# Patient Record
Sex: Female | Born: 1990 | Race: White | Hispanic: No | Marital: Married | Smoking: Never smoker
Health system: Southern US, Community
[De-identification: ages and names within clinical notes are randomized; demographics above are authoritative.]

## PROBLEM LIST (undated history)

## (undated) ENCOUNTER — Inpatient Hospital Stay (HOSPITAL_COMMUNITY): Payer: Self-pay

## (undated) DIAGNOSIS — D219 Benign neoplasm of connective and other soft tissue, unspecified: Secondary | ICD-10-CM

## (undated) DIAGNOSIS — Z789 Other specified health status: Secondary | ICD-10-CM

## (undated) HISTORY — PX: NO PAST SURGERIES: SHX2092

---

## 2017-04-06 NOTE — L&D Delivery Note (Addendum)
Ashley Sanchez is a 27 y.o. G1P0010 who delivered at non-viable fetus at 23. Called to the room by RN as patient felt an urge to push. Cervix was found to be 7-8 cm with small fetal parts felt at the os. Patient involuntary pushed, and fetal arm and cord prolapsed. After another push the feet and the body then delivered. After the third push the rest of the fetus was born. Fetus was non-viable with no signs of life noted at delivery. Cord was cut and clamped. Dr. Benjie Karvonen arrived to the room. She assisted with swaddling the fetus and presenting to the parents. She assumed care for the delivery of the placenta. Please see her notes.   Marcille Buffy 9:18 PM 07/09/17

## 2017-05-21 ENCOUNTER — Other Ambulatory Visit (HOSPITAL_COMMUNITY): Payer: Self-pay | Admitting: Certified Nurse Midwife

## 2017-05-21 ENCOUNTER — Ambulatory Visit (HOSPITAL_COMMUNITY)
Admission: RE | Admit: 2017-05-21 | Discharge: 2017-05-21 | Disposition: A | Discharge status: Home / Self Care | Payer: BC Managed Care – PPO | Source: Ambulatory Visit | Attending: Certified Nurse Midwife | Admitting: Certified Nurse Midwife

## 2017-05-21 DIAGNOSIS — O209 Hemorrhage in early pregnancy, unspecified: Secondary | ICD-10-CM

## 2017-05-21 DIAGNOSIS — Z3A12 12 weeks gestation of pregnancy: Secondary | ICD-10-CM | POA: Insufficient documentation

## 2017-05-21 DIAGNOSIS — O26851 Spotting complicating pregnancy, first trimester: Secondary | ICD-10-CM | POA: Insufficient documentation

## 2017-05-24 ENCOUNTER — Other Ambulatory Visit (HOSPITAL_COMMUNITY): Payer: Self-pay

## 2017-05-24 ENCOUNTER — Ambulatory Visit (HOSPITAL_COMMUNITY): Payer: Self-pay

## 2017-06-07 ENCOUNTER — Other Ambulatory Visit (HOSPITAL_COMMUNITY): Payer: Self-pay | Admitting: Certified Nurse Midwife

## 2017-06-07 DIAGNOSIS — Z3A15 15 weeks gestation of pregnancy: Secondary | ICD-10-CM

## 2017-06-07 DIAGNOSIS — O4692 Antepartum hemorrhage, unspecified, second trimester: Secondary | ICD-10-CM

## 2017-06-07 DIAGNOSIS — Z362 Encounter for other antenatal screening follow-up: Secondary | ICD-10-CM

## 2017-06-08 ENCOUNTER — Other Ambulatory Visit (HOSPITAL_COMMUNITY): Payer: Self-pay | Admitting: Certified Nurse Midwife

## 2017-06-08 ENCOUNTER — Inpatient Hospital Stay (HOSPITAL_COMMUNITY)
Admission: AD | Admit: 2017-06-08 | Discharge: 2017-06-08 | Disposition: A | Discharge status: Home / Self Care | Payer: BC Managed Care – PPO | Source: Ambulatory Visit | Attending: Obstetrics and Gynecology | Admitting: Obstetrics and Gynecology

## 2017-06-08 ENCOUNTER — Encounter (HOSPITAL_COMMUNITY): Payer: Self-pay | Admitting: *Deleted

## 2017-06-08 ENCOUNTER — Ambulatory Visit (HOSPITAL_COMMUNITY)
Admission: RE | Admit: 2017-06-08 | Discharge: 2017-06-08 | Disposition: A | Discharge status: Home / Self Care | Payer: BC Managed Care – PPO | Source: Ambulatory Visit | Attending: Certified Nurse Midwife | Admitting: Certified Nurse Midwife

## 2017-06-08 DIAGNOSIS — O4102X0 Oligohydramnios, second trimester, not applicable or unspecified: Secondary | ICD-10-CM | POA: Insufficient documentation

## 2017-06-08 DIAGNOSIS — O4692 Antepartum hemorrhage, unspecified, second trimester: Secondary | ICD-10-CM

## 2017-06-08 DIAGNOSIS — Z362 Encounter for other antenatal screening follow-up: Secondary | ICD-10-CM

## 2017-06-08 DIAGNOSIS — Z3A15 15 weeks gestation of pregnancy: Secondary | ICD-10-CM

## 2017-06-08 DIAGNOSIS — Z3689 Encounter for other specified antenatal screening: Secondary | ICD-10-CM | POA: Insufficient documentation

## 2017-06-08 DIAGNOSIS — O4592 Premature separation of placenta, unspecified, second trimester: Secondary | ICD-10-CM | POA: Insufficient documentation

## 2017-06-08 DIAGNOSIS — O209 Hemorrhage in early pregnancy, unspecified: Secondary | ICD-10-CM | POA: Diagnosis present

## 2017-06-08 DIAGNOSIS — O3412 Maternal care for benign tumor of corpus uteri, second trimester: Secondary | ICD-10-CM

## 2017-06-08 DIAGNOSIS — O4290 Premature rupture of membranes, unspecified as to length of time between rupture and onset of labor, unspecified weeks of gestation: Secondary | ICD-10-CM

## 2017-06-08 DIAGNOSIS — D259 Leiomyoma of uterus, unspecified: Secondary | ICD-10-CM

## 2017-06-08 LAB — POCT FERN TEST: POCT FERN TEST: NEGATIVE

## 2017-06-08 IMAGING — US US MFM OB DETAIL+14 WK
1 series · 13 of 28 positions shown · non-contrast
Comparison: none

[Series 1: us mfm ob detail+14 wk · 73 acquisitions, 13 frames shown]
[im 3/73]
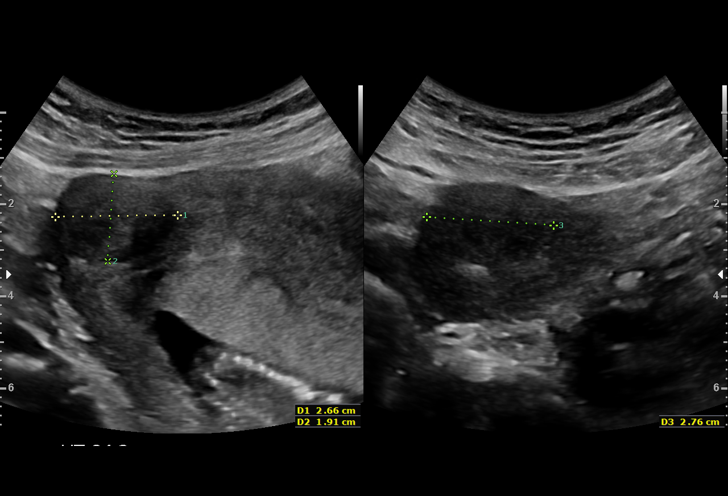
[im 9/73]
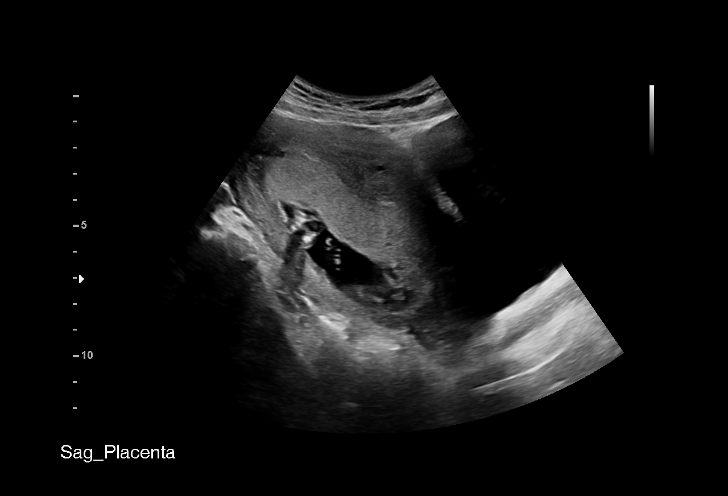
[im 14/73]
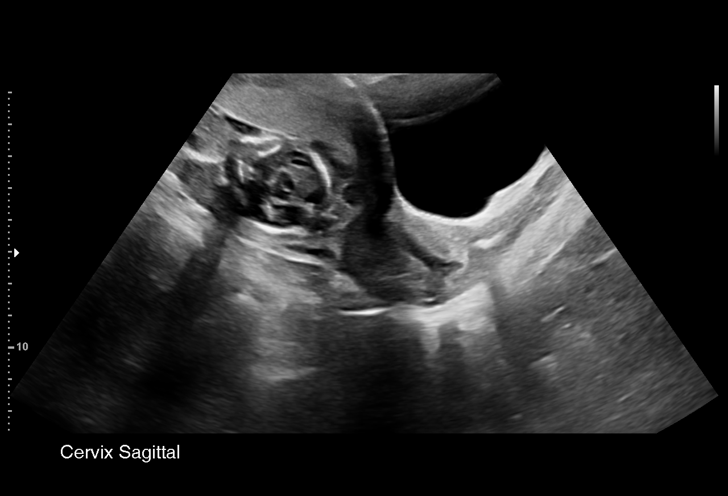
[im 19/73]
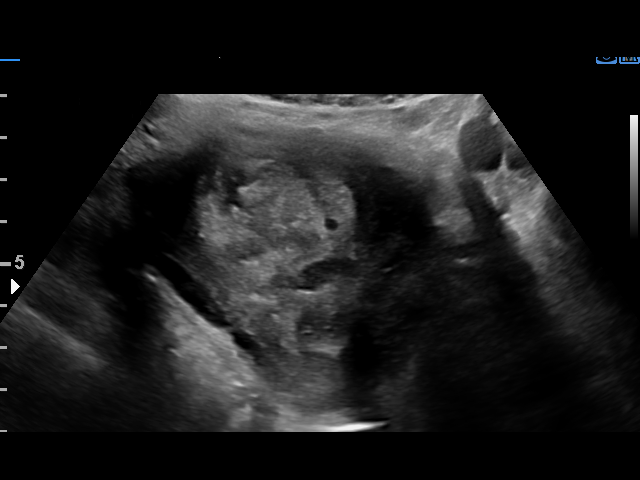
[im 25/73]
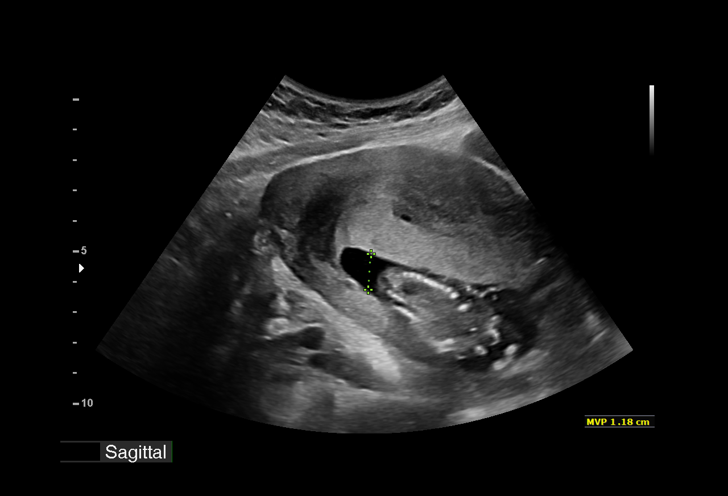
[im 30/73]
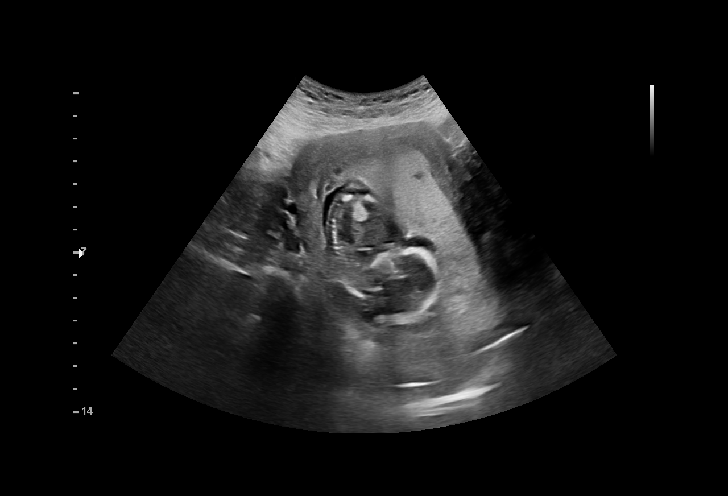
[im 38/73]
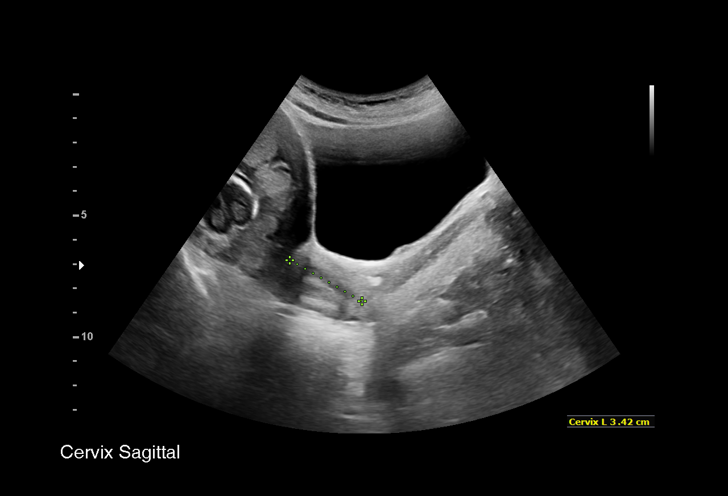
[im 43/73]
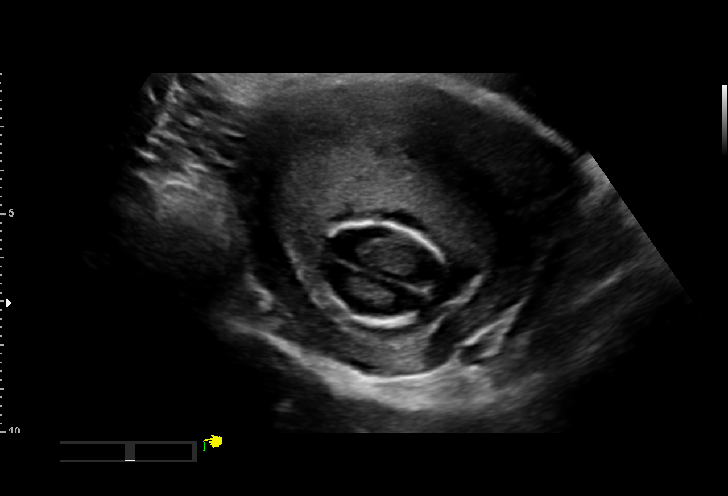
[im 49/73]
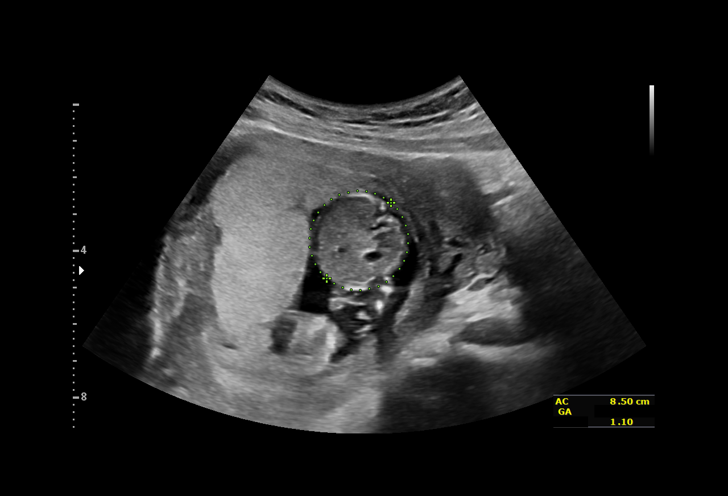
[im 54/73]
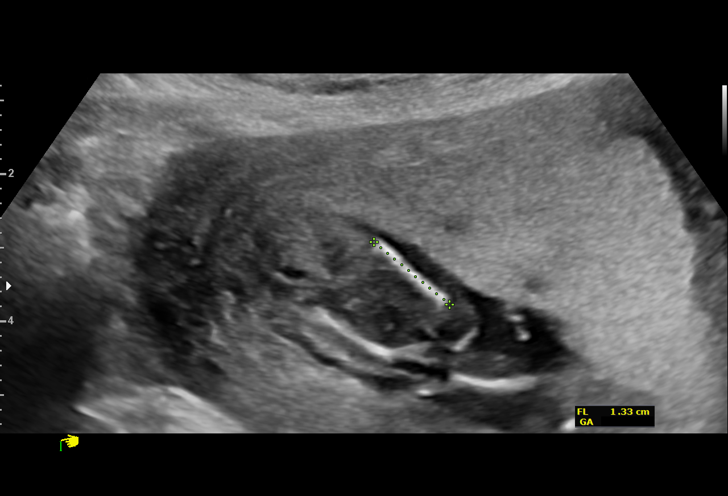
[im 59/73]
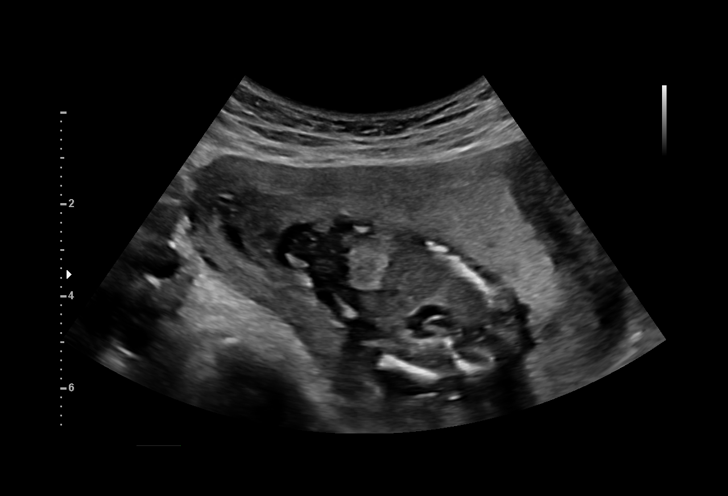
[im 65/73]
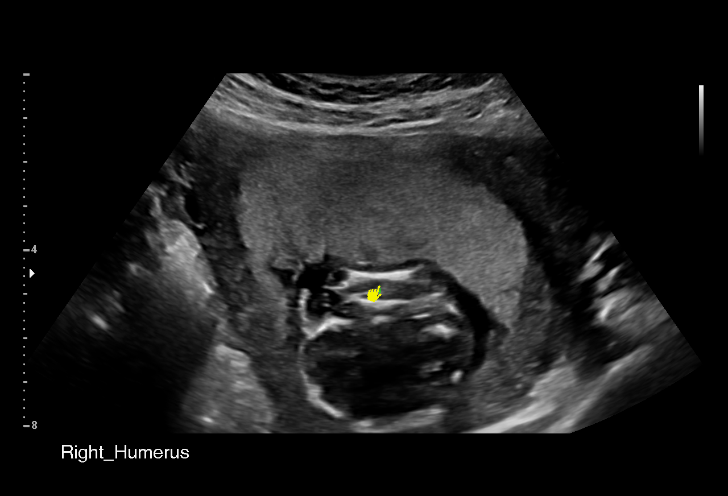
[im 70/73]
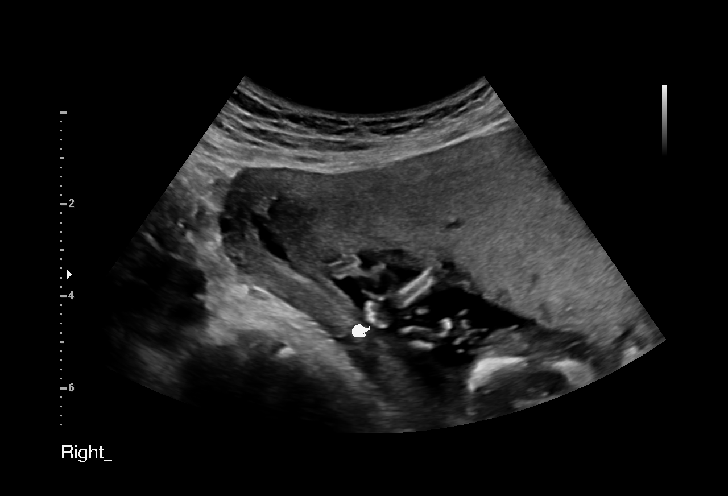

[13 of 28 positions shown; findings below may reference images not displayed]

Center

[facility]             [PHONE_NUMBER]      [PHONE_NUMBER]     [PHONE_NUMBER]
Indications

15 weeks gestation of pregnancy
Vaginal bleeding in pregnancy, second          [AP]
trimester
Oligohydraminios, second trimester,            [AP]
unspecified
Premature rupture of membranes - leaking       [AP]
fluid
Encounter for fetal anatomic survey            [AP]
Fetal Evaluation

Num Of Fetuses:     1
Fetal Heart         154
Rate(bpm):
Cardiac Activity:   Observed
Presentation:       Cephalic
Placenta:           Anterior
P. Cord Insertion:  Not well visualized

Amniotic Fluid
AFI FV:      Oligohydramnios

Largest Pocket(cm)
1.18

Comment:    Moderate subchorionic hemorrhage noted.
Biometry

BPD:      25.9  mm     G. Age:  14w 3d          8  %    CI:        70.18   %    70 - 86
FL/HC:      13.4   %    15.3 -
HC:       98.6  mm     G. Age:  14w 4d          4  %    HC/AC:      1.20        1.05 -
AC:       82.4  mm     G. Age:  14w 4d         25  %    FL/BPD:     51.0   %
FL:       13.2  mm     G. Age:  13w 6d          3  %    FL/AC:      16.0   %    20 - 24
HUM:      13.9  mm     G. Age:  13w 6d        < 5  %

Est. FW:      94  gm      0 lb 3 oz     50  %
Gestational Age

LMP:           15w 3d        Date:  [DATE]                 EDD:   [DATE]
U/S Today:     14w 3d                                        EDD:   [DATE]
Best:          15w 3d     Det. By:  LMP  ([DATE])          EDD:   [DATE]
Anatomy

Cranium:               Appears normal         Aortic Arch:            Not well visualized
Cavum:                 Not well visualized    Ductal Arch:            Not well visualized
Ventricles:            Appears normal         Diaphragm:              Not well visualized
Choroid Plexus:        Appears normal         Stomach:                Appears normal, left
sided
Cerebellum:            Not well visualized    Abdomen:                Not well visualized
Posterior Fossa:       Not well visualized    Abdominal Wall:         Not well visualized
Nuchal Fold:           Not well visualized    Cord Vessels:           Appears normal (3
vessel cord)
Face:                  Not well visualized    Kidneys:                Not well visualized
Lips:                  Not well visualized    Bladder:                Not well visualized
Heart:                 Not well visualized    Spine:                  Not well visualized
RVOT:                  Not well visualized    Upper Extremities:      Visualized
LVOT:                  Not well visualized    Lower Extremities:      Visualized

Other:  Technically difficult due to early gestational age.
Cervix Uterus Adnexa

Cervix
Length:           3.31  cm.

Uterus
Single fibroid noted, see table below.

Left Ovary
Size(cm)       3.4  x   1.8    x  2.2       Vol(ml): 7
Within normal limits.

Right Ovary
Size(cm)       3.6  x   2.1    x  2.5       Vol(ml):
Within normal limits.
Myomas

Site                     L(cm)      W(cm)      D(cm)      Location
Fundus

Blood Flow                 RI        PI       Comments

Impression

Single living intrauterine pregnancy at 15w 3d.
Placenta Anterior with noted subchorionic
hemorrhage/marginal abruption at the leading edge and
measures 0.9 x 1.5 x 1.2 cm
Appropriate fetal growth.
amniotic fluid volume is decreased raising suspicion for
pPROM, especially in context of patient reports of watery,
bloody gushes of fluid per vagina
No gross fetal anomalies identified in this survey limited by
early gestational age, noting the stomach is left-sided and fills
normally.  fetal bladder appears present but is not see well
enough for definitive evaluation given early gestational age
The cervix measures 3.31cm transabdominally without
funneling.
The adnexa appear normal bilaterally without masses.
Recommendations

Patient sent to [facility] for evaluation for pPROM and vaginal
bleeding by sterile speculum examination and Amnisure.
Follow up thereafter to be predicated by results of
examination.  That being said, it would be reasonable to
assess fetal viability by limited ultrasound weekly given
risk/concern for pregnancy loss due to marginal abruption
and patient report of significant vaginal bleeding.  I would
recommend such evaluations with MFM to facilitate continued
detailed evaluation of the fetal anatomy to assess organ
development (eg, fetal kidneys, etc).

## 2017-06-08 NOTE — Discharge Instructions (Signed)
Vaginal Bleeding During Pregnancy, Second Trimester A small amount of bleeding (spotting) from the vagina is relatively common in pregnancy. It usually stops on its own. Various things can cause bleeding or spotting in pregnancy. Some bleeding may be related to the pregnancy, and some may not. Sometimes the bleeding is normal and is not a problem. However, bleeding can also be a sign of something serious. Be sure to tell your health care provider about any vaginal bleeding right away. Some possible causes of vaginal bleeding during the second trimester include:  Infection, inflammation, or growths on the cervix.  The placenta may be partially or completely covering the opening of the cervix inside the uterus (placenta previa).  The placenta may have separated from the uterus (abruption of the placenta).  You may be having early (preterm) labor.  The cervix may not be strong enough to keep a baby inside the uterus (cervical insufficiency).  Tiny cysts may have developed in the uterus instead of pregnancy tissue (molar pregnancy).  Follow these instructions at home: Watch your condition for any changes. The following actions may help to lessen any discomfort you are feeling:  Follow your health care provider's instructions for limiting your activity. If your health care provider orders bed rest, you may need to stay in bed and only get up to use the bathroom. However, your health care provider may allow you to continue light activity.  If needed, make plans for someone to help with your regular activities and responsibilities while you are on bed rest.  Keep track of the number of pads you use each day, how often you change pads, and how soaked (saturated) they are. Write this down.  Do not use tampons. Do not douche.  Do not have sexual intercourse or orgasms until approved by your health care provider.  If you pass any tissue from your vagina, save the tissue so you can show it to your  health care provider.  Only take over-the-counter or prescription medicines as directed by your health care provider.  Do not take aspirin because it can make you bleed.  Do not exercise or perform any strenuous activities or heavy lifting without your health care provider's permission.  Keep all follow-up appointments as directed by your health care provider.  Contact a health care provider if:  You have any vaginal bleeding during any part of your pregnancy.  You have cramps or labor pains.  You have a fever, not controlled by medicine. Get help right away if:  You have severe cramps in your back or belly (abdomen).  You have contractions.  You have chills.  You pass large clots or tissue from your vagina.  Your bleeding increases.  You feel light-headed or weak, or you have fainting episodes.  Pelvic Rest Pelvic rest may be recommended if: Your placenta is partially or completely covering the opening of your cervix (placenta previa). There is bleeding between the wall of the uterus and the amniotic sac in the first trimester of pregnancy (subchorionic hemorrhage). You went into labor too early (preterm labor).  Based on your overall health and the health of your baby, your health care provider will decide if pelvic rest is right for you. How do I rest my pelvis? For as long as told by your health care provider: Do not have sex, sexual stimulation, or an orgasm. Do not use tampons. Do not douche. Do not put anything in your vagina. Do not lift anything that is heavier than 10 lb (  4.5 kg). Avoid activities that take a lot of effort (are strenuous). Avoid any activity in which your pelvic muscles could become strained.  When should I seek medical care? Seek medical care if you have: Cramping pain in your lower abdomen. Vaginal discharge. A low, dull backache. Regular contractions. Uterine tightening.  When should I seek immediate medical care? Seek immediate  medical care if: You have vaginal bleeding and you are pregnant.  This information is not intended to replace advice given to you by your health care provider. Make sure you discuss any questions you have with your health care provider. Document Released: 07/18/2010 Document Revised: 08/29/2015 Document Reviewed: 09/24/2014 Elsevier Interactive Patient Education  2018 [place] are leaking fluid or have a gush of fluid from your vagina. This information is not intended to replace advice given to you by your health care provider. Make sure you discuss any questions you have with your health care provider. Document Released: 12/31/2004 Document Revised: 08/29/2015 Document Reviewed: 11/28/2012 Elsevier Interactive Patient Education  [employer].

## 2017-06-08 NOTE — MAU Note (Signed)
Pt sent from MFM s/p u/s that showed ROM at 15 weeks

## 2017-06-08 NOTE — MAU Provider Note (Signed)
History    Arielle Strassman ia a 27 y.o. at [redacted]w[redacted]d, seen in MFM / ultrasound for persisting vaginal bleeding. Noted to have minimal fluid and sent to [facility] to evaluate for PPROM.   CSN: [id number]  Arrival date and time: 06/08/17 1610   None     Chief Complaint  Patient presents with  . Vaginal Bleeding   HPI Patient reported spotting at 12+ wks and sono then showed small [facility]. Bleeding continued intermittent despite modified rest at home and pelvic rest. Patient reported several gushes of bloody discharge at home over the weekend, enough to fill out a panty liner and soil her clothes.  Denies fever/chills. No cramping, notes some soreness over lowe abdomen since yesterday.   Prenatal care at [facility] since 12 wks gest.  Prenatal labs O pos, AB neg, RNI, HIV/HepB/RPR neg, UCX neg. GC/CT neg. Declined genetic screen.  OB History    Gravida Para Term Preterm AB Living   1             SAB TAB Ectopic Multiple Live Births                 Medical hx non-contributory No sig family medical hx  Social History   Tobacco Use  . Smoking status: Not on file  Substance Use Topics  . Alcohol use: Not on file  . Drug use: Not on file    Allergies: Allergies not on file  No medications prior to admission.    Review of Systems  Constitutional: Positive for fever. Negative for chills.  Respiratory: Negative for shortness of breath.   Genitourinary: Positive for vaginal bleeding and vaginal discharge. Negative for pelvic pain.  Musculoskeletal: Negative for arthralgias.  Neurological: Negative for dizziness and headaches.   Physical Exam   Blood pressure 132/76, pulse 92, temperature 98.4 F (36.9 C), temperature source Oral, resp. rate 16, SpO2 97 %.  Physical Exam  Nursing note and vitals reviewed. Constitutional: She is oriented to person, place, and time. She appears well-developed and well-nourished.  HENT:  Head: Normocephalic.  Respiratory: Effort normal and  breath sounds normal.  GI: Soft. There is no tenderness.  Genitourinary: Vagina normal. Pelvic exam was performed with patient supine. Cervix exhibits discharge. Cervix exhibits no motion tenderness and no friability.  Genitourinary Comments: SSE done, small amount of bloody fluid in posterior fornix, no gushing w/ valsalva, negative ferning. Amnisure deferred (too bloody)  Musculoskeletal: Normal range of motion. She exhibits no edema.  Neurological: She is alert and oriented to person, place, and time.  Skin: Skin is warm and dry.  Psychiatric: She has a normal mood and affect.    MAU Course  Procedures ----------------------------------------------------------------------  OBSTETRICS REPORT                      (Signed Final 06/08/2017 04:39 pm) ---------------------------------------------------------------------- Patient Info  ID #:       [id number]                          D.O.B.:  08/14/1990 (26 yrs)  Name:       Jenene Slicker                   Visit Date: 06/08/2017 01:28 pm ---------------------------------------------------------------------- Performed By  Performed By:     Corky Crafts             Ref. Address:     [employer]  [facility]                                                             Center  Attending:        Oralia Rud       Location:         [facility]                    MD  Referred By:      Artelia Laroche                    CNM ---------------------------------------------------------------------- Orders   #  Description                                 Code   1  [place] MFM OB DETAIL +14 [place]                     76811.01  ----------------------------------------------------------------------   #  Ordered By               Order #        Accession #    Episode #   1  Artelia Laroche             [id number]      [id number]     [id number]  ---------------------------------------------------------------------- Indications   [redacted] weeks gestation of  pregnancy                Z3A.15   Vaginal bleeding in pregnancy, second          O46.92   trimester   Oligohydraminios, second trimester,            O41.02X0   unspecified   Premature rupture of membranes - leaking       O42.90   fluid   Encounter for fetal anatomic survey            Z36.89  ---------------------------------------------------------------------- Fetal Evaluation  Num Of Fetuses:     1  Fetal Heart         154  Rate(bpm):  Cardiac Activity:   Observed  Presentation:       Cephalic  Placenta:           Anterior  P. Cord Insertion:  Not well visualized  Amniotic Fluid  AFI FV:      Oligohydramnios                              Largest Pocket(cm)                              1.18  Comment:    Moderate subchorionic hemorrhage noted. ---------------------------------------------------------------------- Biometry  BPD:      25.9  mm     G. Age:  14w 3d          8  %    CI:        70.18   %    70 - 86  FL/HC:      13.4   %    15.3 - 17.1  HC:       98.6  mm     G. Age:  14w 4d          4  %    HC/AC:      1.20        1.05 - 1.39  AC:       82.4  mm     G. Age:  14w 4d         25  %    FL/BPD:     51.0   %  FL:       13.2  mm     G. Age:  13w 6d          3  %    FL/AC:      16.0   %    20 - 24  HUM:      13.9  mm     G. Age:  13w 6d        < 5  %  Est. FW:      94  gm      0 lb 3 oz     50  % ---------------------------------------------------------------------- Gestational Age  LMP:           15w 3d        Date:  02/20/17                 EDD:   11/27/17  U/S Today:     14w 3d                                        EDD:   12/04/17  Best:          15w 3d     Det. By:  LMP  (02/20/17)          EDD:   11/27/17 ---------------------------------------------------------------------- Anatomy  Cranium:               Appears normal         Aortic Arch:            Not well visualized  Cavum:                 Not well visualized     Ductal Arch:            Not well visualized  Ventricles:            Appears normal         Diaphragm:              Not well visualized  Choroid Plexus:        Appears normal         Stomach:                Appears normal, left                                                                        sided  Cerebellum:  Not well visualized    Abdomen:                Not well visualized  Posterior Fossa:       Not well visualized    Abdominal Wall:         Not well visualized  Nuchal Fold:           Not well visualized    Cord Vessels:           Appears normal (3                                                                        vessel cord)  Face:                  Not well visualized    Kidneys:                Not well visualized  Lips:                  Not well visualized    Bladder:                Not well visualized  Heart:                 Not well visualized    Spine:                  Not well visualized  RVOT:                  Not well visualized    Upper Extremities:      Visualized  LVOT:                  Not well visualized    Lower Extremities:      Visualized  Other:  Technically difficult due to early gestational age. ---------------------------------------------------------------------- Cervix Uterus Adnexa  Cervix  Length:           3.31  cm.  Uterus  Single fibroid noted, see table below.  Left Ovary  Size(cm)       3.4  x   1.8    x  2.2       Vol(ml): 7  Within normal limits.  Right Ovary  Size(cm)       3.6  x   2.1    x  2.5       Vol(ml): 9.9  Within normal limits. ---------------------------------------------------------------------- Myomas   Site                     L(cm)      W(cm)      D(cm)      Location   Fundus                   2.7        1.9        2.8  ----------------------------------------------------------------------   Blood Flow                 RI        PI       Comments   ---------------------------------------------------------------------- Impression  Single living intrauterine pregnancy at 15w  3d.  Placenta Anterior with noted subchorionic  hemorrhage/marginal abruption at the leading edge and  measures 0.9 x 1.5 x 1.2 cm  Appropriate fetal growth.  amniotic fluid volume is decreased raising suspicion for  pPROM, especially in context of patient reports of watery,  bloody gushes of fluid per vagina  No gross fetal anomalies identified in this survey limited by  early gestational age, noting the stomach is left-sided and fills  normally.  fetal bladder appears present but is not see well  enough for definitive evaluation given early gestational age  The cervix measures 3.31cm transabdominally without  funneling.  The adnexa appear normal bilaterally without masses. ---------------------------------------------------------------------- Recommendations  Patient sent to MAU for evaluation for pPROM and vaginal  bleeding by sterile speculum examination and Amnisure.  Follow up thereafter to be predicated by results of  examination.  That being said, it would be reasonable to  assess fetal viability by limited ultrasound weekly given  risk/concern for pregnancy loss due to marginal abruption  and patient report of significant vaginal bleeding.  I would  recommend such evaluations with MFM to facilitate continued  detailed evaluation of the fetal anatomy to assess organ  development (eg, fetal kidneys, etc). ----------------------------------------------------------------------               Oralia Rud, MD Electronically Signed Final Report   06/08/2017 04:39 pm ----------------------------------------------------------------------    Assessment and Plan  G1 at [redacted]w[redacted]d, pre-viable fetus AGA per MFM sono Vaginal bleeding 2/2 marginal abruption / [facility] [place], high suspicion for PPROM but unable to confirm via ferning, discharge too bloody for  Amnisure  Modified rest at home, pelvic rest Will transfer care to [facility] - Dr. Ronita Hipps for high risk pregnancy, will schedule visit as soon as possible.  Infection precautions and SAB precautions reviewed with patient and her spouse.  Daily temperature measurements, call if temp > 100.0  Consult with Dr. Ronita Hipps for [place], CNM 06/08/2017, 4:56 PM

## 2017-07-08 ENCOUNTER — Inpatient Hospital Stay (EMERGENCY_DEPARTMENT_HOSPITAL)
Admission: AD | Admit: 2017-07-08 | Discharge: 2017-07-08 | Disposition: A | Discharge status: Home / Self Care | Payer: BC Managed Care – PPO | Source: Ambulatory Visit | Attending: Obstetrics and Gynecology | Admitting: Obstetrics and Gynecology

## 2017-07-08 ENCOUNTER — Encounter (HOSPITAL_COMMUNITY): Payer: Self-pay | Admitting: *Deleted

## 2017-07-08 DIAGNOSIS — O42912 Preterm premature rupture of membranes, unspecified as to length of time between rupture and onset of labor, second trimester: Secondary | ICD-10-CM | POA: Diagnosis not present

## 2017-07-08 DIAGNOSIS — O039 Complete or unspecified spontaneous abortion without complication: Secondary | ICD-10-CM | POA: Diagnosis not present

## 2017-07-08 DIAGNOSIS — O4692 Antepartum hemorrhage, unspecified, second trimester: Secondary | ICD-10-CM

## 2017-07-08 DIAGNOSIS — O4290 Premature rupture of membranes, unspecified as to length of time between rupture and onset of labor, unspecified weeks of gestation: Secondary | ICD-10-CM | POA: Diagnosis not present

## 2017-07-08 DIAGNOSIS — Z3A19 19 weeks gestation of pregnancy: Secondary | ICD-10-CM

## 2017-07-08 DIAGNOSIS — Z880 Allergy status to penicillin: Secondary | ICD-10-CM

## 2017-07-08 DIAGNOSIS — O411220 Chorioamnionitis, second trimester, not applicable or unspecified: Secondary | ICD-10-CM | POA: Diagnosis present

## 2017-07-08 DIAGNOSIS — O42919 Preterm premature rupture of membranes, unspecified as to length of time between rupture and onset of labor, unspecified trimester: Secondary | ICD-10-CM | POA: Diagnosis not present

## 2017-07-08 HISTORY — DX: Other specified health status: Z78.9

## 2017-07-08 HISTORY — DX: Benign neoplasm of connective and other soft tissue, unspecified: D21.9

## 2017-07-08 LAB — URINALYSIS, ROUTINE W REFLEX MICROSCOPIC
Bilirubin Urine: NEGATIVE
GLUCOSE, UA: NEGATIVE mg/dL
KETONES UR: NEGATIVE mg/dL
Nitrite: NEGATIVE
PH: 7 (ref 5.0–8.0)
Protein, ur: NEGATIVE mg/dL
Specific Gravity, Urine: 1.006 (ref 1.005–1.030)

## 2017-07-08 NOTE — MAU Note (Signed)
PT SAYS SHE HAS BEEN  BLEEDING   SINCE 2-10.    THEN SINCE 16 WEEKS - NO FLUID-.  HAS BEEN PASSING MORE  CLOTS    THEN TONIGHT AT 7PM -    FELT TINGLING  AND SHARP PAIN  LEFT ABD  AND LOWER BACK PAIN.   HAS EASED  SOME.   PLAN-  JUST WATCHING .  [place]  WITH DR Ronita Hipps

## 2017-07-08 NOTE — Discharge Instructions (Signed)
Vaginal Bleeding During Pregnancy, Second Trimester A small amount of bleeding (spotting) from the vagina is common in pregnancy. Sometimes the bleeding is normal and is not a problem, and sometimes it is a sign of something serious. Be sure to tell your doctor about any bleeding from your vagina right away. Follow these instructions at home:  Watch your condition for any changes.  Follow your doctor's instructions about how active you can be.  If you are on bed rest: ? You may need to stay in bed and only get up to use the bathroom. ? You may be allowed to do some activities. ? If you need help, make plans for someone to help you.  Write down: ? The number of pads you use each day. ? How often you change pads. ? How soaked (saturated) your pads are.  Do not use tampons.  Do not douche.  Do not have sex or orgasms until your doctor says it is okay.  If you pass any tissue from your vagina, save the tissue so you can show it to your doctor.  Only take medicines as told by your doctor.  Do not take aspirin because it can make you bleed.  Do not exercise, lift heavy weights, or do any activities that take a lot of energy and effort unless your doctor says it is okay.  Keep all follow-up visits as told by your doctor. Contact a doctor if:  You bleed from your vagina.  You have cramps.  You have labor pains.  You have a fever that does not go away after you take medicine. Get help right away if:  You have very bad cramps in your back or belly (abdomen).  You have contractions.  You have chills.  You pass large clots or tissue from your vagina.  You bleed more.  You feel light-headed or weak.  You pass out (faint).  You are leaking fluid or have a gush of fluid from your vagina. This information is not intended to replace advice given to you by your health care provider. Make sure you discuss any questions you have with your health care provider. Document  Released: 08/07/2013 Document Revised: 08/29/2015 Document Reviewed: 11/28/2012 Elsevier Interactive Patient Education  2018 [employer]  

## 2017-07-08 NOTE — MAU Provider Note (Signed)
[facility]ulty [facility] OB/GYN Attending MAU Note  Chief Complaint: Vaginal Bleeding    First Provider Initiated Contact with Patient 07/08/17 2135      SUBJECTIVE Ashley Sanchez is a 27 y.o. G1P0 at 105w5d by LMP who presents with increasing abdominal pain and low back pain. Noted to have increased vaginal bleeding over the last 48 hours. Patient is a patient of Dr. Ronita Hipps from [facility] with presumed Pre-viable PPROM at 50 wks. No fluid on u/s. She has been bleeding since 16 wks.  Past Medical History:  Diagnosis Date  . Fibroid   . Medical history non-contributory    OB History  Gravida Para Term Preterm AB Living  1            SAB TAB Ectopic Multiple Live Births               # Outcome Date GA Lbr Len/2nd Weight Sex Delivery Anes PTL Lv  1 Current            Past Surgical History:  Procedure Laterality Date  . NO PAST SURGERIES     Social History   Socioeconomic History  . Marital status: Married    Spouse name: Not on file  . Number of children: Not on file  . Years of education: Not on file  . Highest education level: Not on file  Occupational History  . Not on file  Social Needs  . Financial resource strain: Not on file  . Food insecurity:    Worry: Not on file    Inability: Not on file  . Transportation needs:    Medical: Not on file    Non-medical: Not on file  Tobacco Use  . Smoking status: Not on file  Substance and Sexual Activity  . Alcohol use: Not on file  . Drug use: Not on file  . Sexual activity: Not on file  Lifestyle  . Physical activity:    Days per week: Not on file    Minutes per session: Not on file  . Stress: Not on file  Relationships  . Social connections:    Talks on phone: Not on file    Gets together: Not on file    Attends religious service: Not on file    Active member of club or organization: Not on file    Attends meetings of clubs or organizations: Not on file    Relationship status: Not on file  . Intimate  partner violence:    Fear of current or ex partner: Not on file    Emotionally abused: Not on file    Physically abused: Not on file    Forced sexual activity: Not on file  Other Topics Concern  . Not on file  Social History Narrative  . Not on file   No current facility-administered medications on file prior to encounter.    No current outpatient medications on file prior to encounter.   Allergies  Allergen Reactions  . Penicillins     ROS: Pertinent items in HPI  OBJECTIVE BP 122/75 (BP Location: Right Arm)   Pulse 100   Temp 98.9 F (37.2 C) (Oral)   Resp 20   Ht 5\' 4"  (1.626 m)   Wt 119 lb (54 kg)   BMI 20.43 kg/m  CONSTITUTIONAL: Well-developed, well-nourished female in no acute distress.  HENT:  Normocephalic, atraumatic EYES: Conjunctivae and EOM are normal.. No scleral icterus.  NECK: Normal range of motion, supple, no masses.   SKIN:  Skin is warm and dry. No rash noted. [place]: Alert and oriented to person, place, and time.  PSYCHIATRIC: Normal mood and affect. Normal behavior. Normal judgment and thought content. CARDIOVASCULAR: Normal heart rate noted RESPIRATORY: Effort  Normal. ABDOMEN: Soft, normal bowel sounds, no distention noted.  No tenderness, rebound or guarding. Uterine fundus is at the umbilicus and is completely non-tender PELVIC: Normal appearing external genitalia; normal appearing vaginal mucosa and cervix which appears closed. Bloody discharge noted. Clot removed with sterile ring forcep. MUSCULOSKELETAL: Normal range of motion. No tenderness.  No  edema.   LAB RESULTS Results for orders placed or performed during the hospital encounter of 07/08/17 (from the past 48 hour(s))  Urinalysis, Routine w reflex microscopic     Status: Abnormal   Collection Time: 07/08/17  8:46 PM  Result Value Ref Range   Color, Urine YELLOW YELLOW   APPearance CLEAR CLEAR   Specific Gravity, Urine 1.006 1.005 - 1.030   pH 7.0 5.0 - 8.0   Glucose, UA NEGATIVE  NEGATIVE mg/dL   Hgb urine dipstick LARGE (A) NEGATIVE   Bilirubin Urine NEGATIVE NEGATIVE   Ketones, ur NEGATIVE NEGATIVE mg/dL   Protein, ur NEGATIVE NEGATIVE mg/dL   Nitrite NEGATIVE NEGATIVE   Leukocytes, UA TRACE (A) NEGATIVE   RBC / HPF 0-5 0 - 5 RBC/hpf   WBC, UA 0-5 0 - 5 WBC/hpf   Bacteria, UA RARE (A) NONE SEEN   Squamous Epithelial / LPF 0-5 (A) NONE SEEN   Mucus PRESENT     Comment: Performed at [facility], [address], [place], [place] [address]      ASSESSMENT 1. Premature rupture of membranes in pregnancy, antepartum   2. Vaginal bleeding in pregnancy, second trimester     PLAN Discussed with Dr. Murrell Redden Call Dr. Kennith Maes office in the am to see if he would want to see her sooner Precautions reviewed Daily temps Return with s/sx's labor or infection. Discharge home Follow-up Information    Brien Few, MD Follow up.   Specialty:  Obstetrics and Gynecology Contact information: [place]  [id number] [phone]          Allergies as of 07/08/2017      Reactions   Penicillins       Medication List    You have not been prescribed any medications.      Donnamae Jude, MD 07/08/2017 10:02 PM

## 2017-07-09 ENCOUNTER — Encounter (HOSPITAL_COMMUNITY): Payer: Self-pay

## 2017-07-09 ENCOUNTER — Inpatient Hospital Stay (HOSPITAL_COMMUNITY)
Admission: AD | Admit: 2017-07-09 | Discharge: 2017-07-10 | DRG: 779 | Disposition: A | Discharge status: Home / Self Care | Payer: BC Managed Care – PPO | Source: Ambulatory Visit | Attending: Obstetrics & Gynecology | Admitting: Obstetrics & Gynecology

## 2017-07-09 ENCOUNTER — Other Ambulatory Visit: Payer: Self-pay

## 2017-07-09 DIAGNOSIS — O039 Complete or unspecified spontaneous abortion without complication: Secondary | ICD-10-CM | POA: Diagnosis present

## 2017-07-09 DIAGNOSIS — O4290 Premature rupture of membranes, unspecified as to length of time between rupture and onset of labor, unspecified weeks of gestation: Secondary | ICD-10-CM | POA: Diagnosis present

## 2017-07-09 DIAGNOSIS — O411220 Chorioamnionitis, second trimester, not applicable or unspecified: Secondary | ICD-10-CM | POA: Diagnosis present

## 2017-07-09 DIAGNOSIS — Z880 Allergy status to penicillin: Secondary | ICD-10-CM | POA: Diagnosis not present

## 2017-07-09 DIAGNOSIS — Z3A19 19 weeks gestation of pregnancy: Secondary | ICD-10-CM | POA: Diagnosis not present

## 2017-07-09 DIAGNOSIS — O42919 Preterm premature rupture of membranes, unspecified as to length of time between rupture and onset of labor, unspecified trimester: Secondary | ICD-10-CM | POA: Diagnosis present

## 2017-07-09 DIAGNOSIS — O42912 Preterm premature rupture of membranes, unspecified as to length of time between rupture and onset of labor, second trimester: Secondary | ICD-10-CM | POA: Diagnosis present

## 2017-07-09 LAB — URINALYSIS, ROUTINE W REFLEX MICROSCOPIC
BILIRUBIN URINE: NEGATIVE
Glucose, UA: NEGATIVE mg/dL
KETONES UR: 20 mg/dL — AB
Nitrite: NEGATIVE
PROTEIN: 30 mg/dL — AB
Specific Gravity, Urine: 1.012 (ref 1.005–1.030)
pH: 7 (ref 5.0–8.0)

## 2017-07-09 LAB — TYPE AND SCREEN
ABO/RH(D): O POS
ANTIBODY SCREEN: NEGATIVE

## 2017-07-09 LAB — CBC
HEMATOCRIT: 27.6 % — AB (ref 36.0–46.0)
Hemoglobin: 9.7 g/dL — ABNORMAL LOW (ref 12.0–15.0)
MCH: 30.9 pg (ref 26.0–34.0)
MCHC: 35.1 g/dL (ref 30.0–36.0)
MCV: 87.9 fL (ref 78.0–100.0)
PLATELETS: 372 10*3/uL (ref 150–400)
RBC: 3.14 MIL/uL — ABNORMAL LOW (ref 3.87–5.11)
RDW: 14 % (ref 11.5–15.5)
WBC: 19.3 10*3/uL — ABNORMAL HIGH (ref 4.0–10.5)

## 2017-07-09 LAB — ABO/RH: ABO/RH(D): O POS

## 2017-07-09 MED ORDER — OXYTOCIN 40 UNITS IN LACTATED RINGERS INFUSION - SIMPLE MED
2.5000 [IU]/h | INTRAVENOUS | Status: DC
  Administered 2017-07-09: 2.5 [IU]/h via INTRAVENOUS
  Filled 2017-07-09: qty 1000

## 2017-07-09 MED ORDER — LACTATED RINGERS IV SOLN
INTRAVENOUS | Status: DC
  Administered 2017-07-09: 18:00:00 via INTRAVENOUS

## 2017-07-09 MED ORDER — LACTATED RINGERS IV SOLN: INTRAVENOUS | Status: DC

## 2017-07-09 MED ORDER — FENTANYL CITRATE (PF) 100 MCG/2ML IJ SOLN
INTRAMUSCULAR | Status: AC
  Administered 2017-07-09: 50 ug via INTRAVENOUS
  Filled 2017-07-09: qty 2

## 2017-07-09 MED ORDER — FENTANYL CITRATE (PF) 100 MCG/2ML IJ SOLN
100.0000 ug | Freq: Once | INTRAMUSCULAR | Status: AC
  Administered 2017-07-09: 100 ug via INTRAVENOUS
  Filled 2017-07-09: qty 2

## 2017-07-09 MED ORDER — MISOPROSTOL 200 MCG PO TABS
1000.0000 ug | ORAL_TABLET | Freq: Once | ORAL | Status: AC
  Administered 2017-07-09: 1000 ug via RECTAL

## 2017-07-09 MED ORDER — OXYTOCIN 10 UNIT/ML IJ SOLN
INTRAMUSCULAR | Status: AC
  Filled 2017-07-09: qty 1

## 2017-07-09 MED ORDER — GENTAMICIN SULFATE 40 MG/ML IJ SOLN
Freq: Once | INTRAMUSCULAR | Status: AC
  Administered 2017-07-09: 21:00:00 via INTRAVENOUS
  Filled 2017-07-09: qty 6.75

## 2017-07-09 MED ORDER — FENTANYL CITRATE (PF) 100 MCG/2ML IJ SOLN
50.0000 ug | Freq: Once | INTRAMUSCULAR | Status: AC
Start: 2017-07-09 — End: 2017-07-09
  Administered 2017-07-09: 50 ug via INTRAVENOUS

## 2017-07-09 MED ORDER — OXYCODONE-ACETAMINOPHEN 5-325 MG PO TABS: 1.0000 | ORAL_TABLET | ORAL | Status: DC | PRN

## 2017-07-09 MED ORDER — LACTATED RINGERS IV SOLN: 500.0000 mL | INTRAVENOUS | Status: DC | PRN

## 2017-07-09 MED ORDER — SOD CITRATE-CITRIC ACID 500-334 MG/5ML PO SOLN: 30.0000 mL | ORAL | Status: DC | PRN

## 2017-07-09 MED ORDER — LIDOCAINE HCL (PF) 1 % IJ SOLN
30.0000 mL | INTRAMUSCULAR | Status: DC | PRN
  Filled 2017-07-09: qty 30

## 2017-07-09 MED ORDER — OXYTOCIN BOLUS FROM INFUSION
500.0000 mL | Freq: Once | INTRAVENOUS | Status: AC
  Administered 2017-07-09: 500 mL via INTRAVENOUS

## 2017-07-09 MED ORDER — ONDANSETRON HCL 4 MG/2ML IJ SOLN: 4.0000 mg | Freq: Four times a day (QID) | INTRAMUSCULAR | Status: DC | PRN

## 2017-07-09 MED ORDER — ACETAMINOPHEN 325 MG PO TABS: 650.0000 mg | ORAL_TABLET | ORAL | Status: DC | PRN

## 2017-07-09 MED ORDER — MISOPROSTOL 200 MCG PO TABS: 1000.0000 ug | ORAL_TABLET | Freq: Once | ORAL | Status: DC

## 2017-07-09 MED ORDER — MISOPROSTOL 200 MCG PO TABS
ORAL_TABLET | ORAL | Status: AC
  Administered 2017-07-09: 1000 ug via RECTAL
  Filled 2017-07-09: qty 5

## 2017-07-09 MED ORDER — OXYCODONE-ACETAMINOPHEN 5-325 MG PO TABS: 2.0000 | ORAL_TABLET | ORAL | Status: DC | PRN

## 2017-07-09 NOTE — MAU Note (Signed)
Patient awaiting room, called out for RN to report to bedside due to an urge to push.  RN asked Marcille Buffy, CNM to report to bedside to perform cervical exam on patient.  Determined patient was 7-8 cm dilated and felt presenting parts.  Patient delivered with CNM, RN, and husband at the bedside.  Dr. Benjie Karvonen reported following delivery and assessed patient.  Patient subsequently delivered the placenta 1 hour later.  Dr. Benjie Karvonen reassessed patient and admitted to [facility] unit for observation and to receive antibiotics.

## 2017-07-09 NOTE — Progress Notes (Signed)
Delivery note: Fetus delivered while patient was waiting in MAU room for L&D transfer. She received Fentanyl 167mcg after arriving for painful contractions.  I was in room right after fetus delivered by MAU provider. Wrapped in blanket, handed to mother.  Exam - placenta not in vagina.  Cytotec 1000 mcg placed rectally.  Vaginal walls felt warm and foul odor c/w chorioamnionitis noted, so Gentamicin and Clindamycin -single dose given. 74mcg Fentanyl given, no clinical concerns stable vitals.  After few minutes, I returned, placenta felt at the cervix.   Bedside sterile speculum exam done, with sterile Rings, placenta was removed, intact. Blood clots removed. Bimanually, cervix felt it was closing up and no active bleeding noted.  Bedside sono done by me, noted no retained POCs.   Transfer to Women's unit, complete abx, regular diet, overnight watch for any worsening s/s of infection. CBC in AM Stable patient, accepting outcome very well. Husband in room and family will be arriving soon.   Planning hospital cremation since <20 wks  V.Benjie Karvonen, MD

## 2017-07-09 NOTE — Progress Notes (Signed)
Dr Benjie Karvonen called to come to MAU for delivery

## 2017-07-09 NOTE — H&P (Addendum)
27 y.o. female G1P0 @ [redacted]w[redacted]d with known oligohydramnios' with PPROM as of 16 weeks, expectant management at home, presentes to MAU with heavier vaginal bleeding and contractions that became very intense today. States she is having intense back labor. Says her bleeding has been off and on for 2 months. More like a period today. Denies fever/ chills/ bodyaches     Past Medical History:  Diagnosis Date  . Fibroid   . Medical history non-contributory     Past Surgical History:  Procedure Laterality Date  . NO PAST SURGERIES     No family history on file.  Social History   Tobacco Use  . Smoking status: Not on file  Substance Use Topics  . Alcohol use: Not on file  . Drug use: Not on file   Allergies:  Allergies  Allergen Reactions  . Penicillins    Review of Systems  Gastrointestinal: Positive for abdominal pain.  Genitourinary: Positive for vaginal bleeding.   Physical Exam   Blood pressure (!) 126/57, pulse 96, temperature 99 F (37.2 C), temperature source Oral, resp. rate 20, weight 119 lb 4 oz (54.1 kg), SpO2 100 %.  Physical Exam  Constitutional: She is oriented to person, place, and time. She appears well-developed and well-nourished. No distress.  HENT:  Head: Normocephalic.  Eyes: Pupils are equal, round, and reactive to light.  Respiratory: Effort normal.  GI: Soft.  Contractions palpated   Genitourinary:  Genitourinary Comments: Fully dilated and fetus delivered in MAU while awaiting labor room. Placenta retained. Neurological: She is alert and oriented to person, place, and time.  Skin: Skin is warm. She is not diaphoretic.  Psychiatric: Her behavior is normal.   CBC    Component Value Date/Time   WBC 19.3 (H) 07/09/2017 1805   RBC 3.14 (L) 07/09/2017 1805   HGB 9.7 (L) 07/09/2017 1805   HCT 27.6 (L) 07/09/2017 1805   PLT 372 07/09/2017 1805   MCV 87.9 07/09/2017 1805   MCH 30.9 07/09/2017 1805   MCHC 35.1 07/09/2017 1805   RDW 14.0 07/09/2017 1805    O(+) per office records  Assessment and Plan  PPROM, 20 wks, previable fetal delivery, no alive at birth. Spontaneous abortion in MAU. Retained placenta. Cytotec 1000 mcg rectal Fentanyl additional 50 mcg Reassess if D&C needed. Start Gentamicin and Clindamycin due to high WBC and presume chorioamnionitis  Comfort care   [name]

## 2017-07-09 NOTE — MAU Provider Note (Addendum)
History     CSN: [id number]  Arrival date and time: 07/09/17 1718   First Provider Initiated Contact with Patient 07/09/17 1744      Chief Complaint  Patient presents with  . Contractions  . Vaginal Bleeding   HPI   [name] is a 27 y.o. female G1P0 @ [redacted]w[redacted]d with known oligohydramnios' and PPROM as of 16 weeks here in MAU with vaginal bleeding and contractions that became very intense today. States she is having intense back labor. Says her bleeding has been off and on for 2 months. More like a period today.   OB History    Gravida  1   Para      Term      Preterm      AB      Living        SAB      TAB      Ectopic      Multiple      Live Births              Past Medical History:  Diagnosis Date  . Fibroid   . Medical history non-contributory     Past Surgical History:  Procedure Laterality Date  . NO PAST SURGERIES      No family history on file.  Social History   Tobacco Use  . Smoking status: Not on file  Substance Use Topics  . Alcohol use: Not on file  . Drug use: Not on file    Allergies:  Allergies  Allergen Reactions  . Penicillins     No medications prior to admission.   Results for orders placed or performed during the hospital encounter of 07/09/17 (from the past 48 hour(s))  Urinalysis, Routine w reflex microscopic     Status: Abnormal   Collection Time: 07/09/17  5:30 PM  Result Value Ref Range   Color, Urine YELLOW YELLOW   APPearance CLOUDY (A) CLEAR   Specific Gravity, Urine 1.012 1.005 - 1.030   pH 7.0 5.0 - 8.0   Glucose, UA NEGATIVE NEGATIVE mg/dL   Hgb urine dipstick LARGE (A) NEGATIVE   Bilirubin Urine NEGATIVE NEGATIVE   Ketones, ur 20 (A) NEGATIVE mg/dL   Protein, ur 30 (A) NEGATIVE mg/dL   Nitrite NEGATIVE NEGATIVE   Leukocytes, UA MODERATE (A) NEGATIVE   RBC / HPF TOO NUMEROUS TO COUNT 0 - 5 RBC/hpf   WBC, UA 6-30 0 - 5 WBC/hpf   Bacteria, UA RARE (A) NONE SEEN   Squamous Epithelial / LPF  0-5 (A) NONE SEEN   Mucus PRESENT     Comment: Performed at [facility], [address] [place], [place] [address]   Review of Systems  Gastrointestinal: Positive for abdominal pain.  Genitourinary: Positive for vaginal bleeding.   Physical Exam   Blood pressure 116/78, pulse (!) 106, temperature 99 F (37.2 C), temperature source Oral, resp. rate 20, weight 119 lb 4 oz (54.1 kg), SpO2 100 %.  Physical Exam  Constitutional: She is oriented to person, place, and time. She appears well-developed and well-nourished. No distress.  HENT:  Head: Normocephalic.  Eyes: Pupils are equal, round, and reactive to light.  Respiratory: Effort normal.  GI: Soft.  Contractions palpated   Genitourinary:  Genitourinary Comments: Dilation: 2,  Fetal part felt  Bright red blood noted on pad  Exam by:: Noni Saupe, NP  Neurological: She is alert and oriented to person, place, and time.  Skin: Skin is warm. She is  not diaphoretic.  Psychiatric: Her behavior is normal.    MAU Course  Procedures  None  MDM  O positive blood type  Fentanyl 100 mcg given IV IV started Discussed patient with Dr. Benjie Karvonen, Will admit for labor.  Bedside US done: active fetus with normal fetal heart tones.  Assessment and Plan   A:  1. Preterm premature rupture of membranes (PPROM) with unknown onset of labor   2. Indication for care in labor and delivery, antepartum     P:  Admit to labor and delivery Dr. Benjie Karvonen to see patient.

## 2017-07-09 NOTE — MAU Note (Signed)
Thinks she is having contractions, low back low abd.  Every 2 min, lasting 40 seconds. Some bleeding, has been bleeding for 2 months. (moderate on palpation in triage)

## 2017-07-10 DIAGNOSIS — O039 Complete or unspecified spontaneous abortion without complication: Secondary | ICD-10-CM | POA: Diagnosis not present

## 2017-07-10 LAB — RPR: RPR Ser Ql: NONREACTIVE

## 2017-07-10 LAB — CBC WITH DIFFERENTIAL/PLATELET
BASOS ABS: 0 10*3/uL (ref 0.0–0.1)
BASOS PCT: 0 %
EOS ABS: 0 10*3/uL (ref 0.0–0.7)
EOS PCT: 0 %
HCT: 22.8 % — ABNORMAL LOW (ref 36.0–46.0)
Hemoglobin: 8.2 g/dL — ABNORMAL LOW (ref 12.0–15.0)
Lymphocytes Relative: 27 %
Lymphs Abs: 3.6 10*3/uL (ref 0.7–4.0)
MCH: 31.5 pg (ref 26.0–34.0)
MCHC: 36 g/dL (ref 30.0–36.0)
MCV: 87.7 fL (ref 78.0–100.0)
Monocytes Absolute: 0.5 10*3/uL (ref 0.1–1.0)
Monocytes Relative: 3 %
Neutro Abs: 9.4 10*3/uL — ABNORMAL HIGH (ref 1.7–7.7)
Neutrophils Relative %: 70 %
PLATELETS: 311 10*3/uL (ref 150–400)
RBC: 2.6 MIL/uL — AB (ref 3.87–5.11)
RDW: 14 % (ref 11.5–15.5)
WBC: 13.5 10*3/uL — AB (ref 4.0–10.5)

## 2017-07-10 NOTE — Plan of Care (Signed)
  Problem: Education: Goal: Knowledge of General Education information will improve Outcome: Progressing   Problem: Clinical Measurements: Goal: Will remain free from infection Outcome: Progressing   Problem: Clinical Measurements: Goal: Diagnostic test results will improve Outcome: Progressing   Problem: Clinical Measurements: Goal: Ability to maintain clinical measurements within normal limits will improve Outcome: Progressing

## 2017-07-10 NOTE — Progress Notes (Addendum)
Subjective: Patient reports tolerating PO, + flatus and no problems voiding.  Ambulating. No excessive vag bleeding. No breast complaints. Mood is ok, great support  Objective: I have reviewed patient's vital signs, intake and output, medications and labs.  General: alert and cooperative Resp: clear to auscultation bilaterally Cardio: regular rate and rhythm, S1, S2 normal, no murmur, click, rub or gallop GI: soft, non-tender; bowel sounds normal; no masses,  no organomegaly Extremities: Homans sign is negative, no sign of DVT Vaginal Bleeding: minimal   Assessment/Plan: Pregnancy loss at 19.6 wks from chorioamnioniitis and labor after PPROM at 16 wks.  Very stable, overnight bleeding minimal, no fever. S/p Single dose of wt based IV Gent/ Clinda 900 mg post delivery.  No e/o infection now.  DIscharge home F/up Dr Ronita Hipps 2-3 wks, Pelvic rest.  Depression warning s.s reviewed   Counseled on need for 17-hydroxyprogesterone from 16 wks in next pregnancy, will be high risk and not a candidate for Birth center. Pt aware.    LOS: 1 day    Ashley Sanchez 07/10/2017, 8:51 PM

## 2017-07-10 NOTE — Discharge Summary (Signed)
Physician Discharge Summary  Patient ID: Ashley Sanchez MRN: [id number] DOB/AGE: 06-23-1990 27 y.o.  Admit date: 07/09/2017 Discharge date: 07/10/2017  Admission Diagnoses: 19.6 weeks, preterm labor, chorioamnionitis, bleeding. PPROM since 16 weeks   Discharge Diagnoses:  Principal Problem:   Miscarriage 19 wks Active Problems:   Preterm premature rupture of membranes (PPROM) with unknown onset of labor   Premature rupture of membranes (PROM) affecting first pregnancy   Discharged Condition: Improved, no fever/ tenderness. Chorioamnionitis resolved.   Hospital Course: patient came with active labor, delivered in MAU while awaiting labor room bed. Cytotec 1000 mcg placed vaginally and placenta expelled into lower segment and removed manually. Bedside sono by Dr Benjie Karvonen confirmed complete process with no gross retained tissue. After that she was brought to [facility] unit, vitals remained stable overnight. She received IV single weight based dose of Gentamicin and Clindamycin 900 mg after delivery. No evidence of infection at evaluation and hence discharged.  Good family support, coping well with pregnancy loss.   CBC Latest Ref Rng & Units 07/10/2017 07/09/2017  WBC 4.0 - 10.5 K/uL 13.5(H) 19.3(H)  Hemoglobin 12.0 - 15.0 g/dL 8.2(L) 9.7(L)  Hematocrit 36.0 - 46.0 % 22.8(L) 27.6(L)  Platelets 150 - 400 K/uL 311 372    Disposition: Home with husband   Meds:  Ibuprofen or Tylenol as needed for pain.  Take OTC Iron and continue prenatal vitamins   Discharge Instructions    Call MD for:   Complete by:  As directed    Heavy vaginal bleeding   Call MD for:  persistant dizziness or light-headedness   Complete by:  As directed    Call MD for:  persistant nausea and vomiting   Complete by:  As directed    Call MD for:  temperature >100.4   Complete by:  As directed    Diet - low sodium heart healthy   Complete by:  As directed    Increase activity slowly   Complete by:  As directed    Sexual  Activity Restrictions   Complete by:  As directed    4 weeks     Allergies as of 07/10/2017      Reactions   Penicillins    Has patient had a PCN reaction causing immediate rash, facial/tongue/throat swelling, SOB or lightheadedness with hypotension: Yes Has patient had a PCN reaction causing severe rash involving mucus membranes or skin necrosis: No Has patient had a PCN reaction that required hospitalization: No Has patient had a PCN reaction occurring within the last 10 years: Yes If all of the above answers are "NO", then may proceed with Cephalosporin use.      Medication List    TAKE these medications   prenatal multivitamin Tabs tablet Take 1 tablet by mouth daily at 12 noon.      Follow-up Information    Brien Few, MD Follow up in 3 week(s).   Specialty:  Obstetrics and Gynecology Contact information: [place] [address] [phone]           Signed: Elveria Royals 07/10/2017, 8:57 PM

## 2017-07-10 NOTE — Progress Notes (Signed)
Pt discharged home per orders via wheelchair. Discharge instructions and followup reviewed with patient and spouse at bedside. Reviewed emergent symptoms and when to call MD. All belongings sent home with patient.

## 2017-07-10 NOTE — Discharge Instructions (Signed)

## 2019-04-07 NOTE — L&D Delivery Note (Signed)
Delivery Note:   G2P1001 at [redacted]w[redacted]d  Admitting diagnosis: Normal labor [O80, Z37.9] Risks:  1. History of PPROM and placental abruption in G1 delivered at 20 weeks 2. Uterine fibroid 12cm x 12cm x 11cm  Onset of labor: 1330 IOL/Augmentation: N/A ROM: SROM 1740, clear AF  Complete dilation at 01/25/2020  1812 Onset of pushing at 1812 FHR second stage cat 1  Analgesia /Anesthesia intrapartum:Local  Pushing in L and R lateral position with CNM and L&D staff support, Elmer FOB present for birth and supportive.  Delivery of a Live born female  Birth Weight: 8 lb 5.5 oz (3785 g) APGAR: 9, 9  Newborn Delivery   Birth date/time: 01/25/2020 19:04:00 Delivery type: Vaginal, Spontaneous      in cephalic presentation, position OA to ROT.  Nuchal Cord: No  Cord double clamped after placenta delivery, cut by [employer].  Collection of cord blood for typing completed. Cord blood donation-None  Arterial cord blood sample-  none  Placenta delivered-Spontaneous  with 3 vessels . Uterotonics: none Placenta to L&D for disposal. Uterine tone firm, bleeding small  2nd degree  laceration identified.  Episiotomy:None  Local analgesia: 1% lido  Repair: 3.0 vicryl in standard fashion  Est. Blood Loss (ZO):109.60   Complications: None   Mom to postpartum.  Baby Monroe to Couplet care / Skin to Skin.  Delivery Report:  Review the Delivery Report for details.     Signed: Juliene Pina, CNM, MSN 01/27/2020, 4:14 PM

## 2020-01-25 ENCOUNTER — Inpatient Hospital Stay (HOSPITAL_COMMUNITY)
Admission: AD | Admit: 2020-01-25 | Discharge: 2020-01-27 | DRG: 807 | Disposition: A | Discharge status: Home / Self Care | Payer: BC Managed Care – PPO | Attending: Obstetrics and Gynecology | Admitting: Obstetrics and Gynecology

## 2020-01-25 ENCOUNTER — Other Ambulatory Visit: Payer: Self-pay

## 2020-01-25 ENCOUNTER — Encounter (HOSPITAL_COMMUNITY): Payer: Self-pay | Admitting: Obstetrics and Gynecology

## 2020-01-25 DIAGNOSIS — Z20822 Contact with and (suspected) exposure to covid-19: Secondary | ICD-10-CM | POA: Diagnosis present

## 2020-01-25 DIAGNOSIS — Z880 Allergy status to penicillin: Secondary | ICD-10-CM

## 2020-01-25 DIAGNOSIS — O26893 Other specified pregnancy related conditions, third trimester: Secondary | ICD-10-CM | POA: Diagnosis present

## 2020-01-25 DIAGNOSIS — Z3A40 40 weeks gestation of pregnancy: Secondary | ICD-10-CM | POA: Diagnosis not present

## 2020-01-25 DIAGNOSIS — D259 Leiomyoma of uterus, unspecified: Secondary | ICD-10-CM | POA: Diagnosis present

## 2020-01-25 DIAGNOSIS — O3413 Maternal care for benign tumor of corpus uteri, third trimester: Principal | ICD-10-CM | POA: Diagnosis present

## 2020-01-25 LAB — TYPE AND SCREEN
ABO/RH(D): O POS
Antibody Screen: NEGATIVE

## 2020-01-25 LAB — RESPIRATORY PANEL BY RT PCR (FLU A&B, COVID)
Influenza A by PCR: NEGATIVE
Influenza B by PCR: NEGATIVE
SARS Coronavirus 2 by RT PCR: NEGATIVE

## 2020-01-25 LAB — CBC
HCT: 43.9 % (ref 36.0–46.0)
Hemoglobin: 15.2 g/dL — ABNORMAL HIGH (ref 12.0–15.0)
MCH: 32.8 pg (ref 26.0–34.0)
MCHC: 34.6 g/dL (ref 30.0–36.0)
MCV: 94.6 fL (ref 80.0–100.0)
Platelets: 289 10*3/uL (ref 150–400)
RBC: 4.64 MIL/uL (ref 3.87–5.11)
RDW: 12.8 % (ref 11.5–15.5)
WBC: 16.1 10*3/uL — ABNORMAL HIGH (ref 4.0–10.5)
nRBC: 0 % (ref 0.0–0.2)

## 2020-01-25 MED ORDER — SENNOSIDES-DOCUSATE SODIUM 8.6-50 MG PO TABS
2.0000 | ORAL_TABLET | ORAL | Status: DC
  Administered 2020-01-25 – 2020-01-27 (×2): 2 via ORAL
  Filled 2020-01-25 (×2): qty 2

## 2020-01-25 MED ORDER — SIMETHICONE 80 MG PO CHEW: 80.0000 mg | CHEWABLE_TABLET | ORAL | Status: DC | PRN

## 2020-01-25 MED ORDER — COCONUT OIL OIL: 1.0000 "application " | TOPICAL_OIL | Status: DC | PRN

## 2020-01-25 MED ORDER — OXYTOCIN BOLUS FROM INFUSION: 333.0000 mL | Freq: Once | INTRAVENOUS | Status: DC

## 2020-01-25 MED ORDER — FENTANYL CITRATE (PF) 100 MCG/2ML IJ SOLN: 50.0000 ug | INTRAMUSCULAR | Status: DC | PRN

## 2020-01-25 MED ORDER — LACTATED RINGERS IV SOLN: 500.0000 mL | INTRAVENOUS | Status: DC | PRN

## 2020-01-25 MED ORDER — ACETAMINOPHEN 325 MG PO TABS: 650.0000 mg | ORAL_TABLET | ORAL | Status: DC | PRN

## 2020-01-25 MED ORDER — DIPHENHYDRAMINE HCL 25 MG PO CAPS: 25.0000 mg | ORAL_CAPSULE | Freq: Four times a day (QID) | ORAL | Status: DC | PRN

## 2020-01-25 MED ORDER — SODIUM CHLORIDE 0.9% FLUSH: 3.0000 mL | INTRAVENOUS | Status: DC | PRN

## 2020-01-25 MED ORDER — ONDANSETRON HCL 4 MG/2ML IJ SOLN: 4.0000 mg | Freq: Four times a day (QID) | INTRAMUSCULAR | Status: DC | PRN

## 2020-01-25 MED ORDER — BENZOCAINE-MENTHOL 20-0.5 % EX AERO
1.0000 "application " | INHALATION_SPRAY | CUTANEOUS | Status: DC | PRN
  Administered 2020-01-26: 1 via TOPICAL
  Filled 2020-01-25: qty 56

## 2020-01-25 MED ORDER — OXYTOCIN 10 UNIT/ML IJ SOLN: 10.0000 [IU] | Freq: Once | INTRAMUSCULAR | Status: DC

## 2020-01-25 MED ORDER — WITCH HAZEL-GLYCERIN EX PADS: 1.0000 "application " | MEDICATED_PAD | CUTANEOUS | Status: DC | PRN

## 2020-01-25 MED ORDER — PRENATAL MULTIVITAMIN CH
1.0000 | ORAL_TABLET | Freq: Every day | ORAL | Status: DC
  Administered 2020-01-26: 1 via ORAL
  Filled 2020-01-25: qty 1

## 2020-01-25 MED ORDER — TETANUS-DIPHTH-ACELL PERTUSSIS 5-2.5-18.5 LF-MCG/0.5 IM SUSP: 0.5000 mL | Freq: Once | INTRAMUSCULAR | Status: DC

## 2020-01-25 MED ORDER — DIBUCAINE (PERIANAL) 1 % EX OINT: 1.0000 "application " | TOPICAL_OINTMENT | CUTANEOUS | Status: DC | PRN

## 2020-01-25 MED ORDER — SODIUM CHLORIDE 0.9 % IV SOLN: 250.0000 mL | INTRAVENOUS | Status: DC | PRN

## 2020-01-25 MED ORDER — ZOLPIDEM TARTRATE 5 MG PO TABS: 5.0000 mg | ORAL_TABLET | Freq: Every evening | ORAL | Status: DC | PRN

## 2020-01-25 MED ORDER — SODIUM CHLORIDE 0.9% FLUSH: 3.0000 mL | Freq: Two times a day (BID) | INTRAVENOUS | Status: DC

## 2020-01-25 MED ORDER — SOD CITRATE-CITRIC ACID 500-334 MG/5ML PO SOLN: 30.0000 mL | ORAL | Status: DC | PRN

## 2020-01-25 MED ORDER — OXYTOCIN-SODIUM CHLORIDE 30-0.9 UT/500ML-% IV SOLN: 2.5000 [IU]/h | INTRAVENOUS | Status: DC

## 2020-01-25 MED ORDER — LACTATED RINGERS IV SOLN: INTRAVENOUS | Status: DC

## 2020-01-25 MED ORDER — ACETAMINOPHEN 500 MG PO TABS: 1000.0000 mg | ORAL_TABLET | Freq: Four times a day (QID) | ORAL | Status: DC | PRN

## 2020-01-25 MED ORDER — ONDANSETRON HCL 4 MG PO TABS: 4.0000 mg | ORAL_TABLET | ORAL | Status: DC | PRN

## 2020-01-25 MED ORDER — ONDANSETRON HCL 4 MG/2ML IJ SOLN: 4.0000 mg | INTRAMUSCULAR | Status: DC | PRN

## 2020-01-25 MED ORDER — IBUPROFEN 600 MG PO TABS
600.0000 mg | ORAL_TABLET | Freq: Four times a day (QID) | ORAL | Status: DC
  Administered 2020-01-25 – 2020-01-27 (×5): 600 mg via ORAL
  Filled 2020-01-25 (×5): qty 1

## 2020-01-25 MED ORDER — LIDOCAINE HCL (PF) 1 % IJ SOLN
30.0000 mL | INTRAMUSCULAR | Status: DC | PRN
  Filled 2020-01-25: qty 30

## 2020-01-25 NOTE — H&P (Signed)
OB ADMISSION/ HISTORY & PHYSICAL:  Admission Date: 01/25/2020  2:19 PM  Admit Diagnosis: Normal labor [O80, Z37.9]    Ashley Sanchez is a 29 y.o. female presenting for contractions every 5 minutes. States her contractions began early this AM and she was seen in the office. SVE in office was 4-5cm. She was sent to the hospital for direct admission. Denies leaking of fluid or vaginal bleeding. Endorses + fetal movement. Husband, Elmer, at the bedside providing support. Expecting baby boy "Monroe".   Prenatal History: G2P1   EDC :  Prenatal care at [facility] since 9 weeks, primary Dr. Ronita Hipps   Prenatal course complicated by: 1. History of PPROM and placental abruption in G1 delivered at 20 weeks 2. Uterine fibroid 12cm x 12cm x 11cm  Prenatal Labs: ABO, Rh:   O POS Antibody: NEG (10/21 1725) Rubella:   Immune RPR:   Non-reactive HBsAg:   Negative HIV:   Negative GBS:   Negative 1 hr Glucola : 77 Genetic Screening: Declined NIPS, AFP negative Ultrasound: normal XY anatomy, anterior placenta Growth at 37 weeks AFI 16cm, 7lb 2oz, 68%  TDAP: UTD FLU: At discharge COVID-19: Unknown    Maternal Diabetes: No Genetic Screening: Declined Maternal Ultrasounds/Referrals: Normal Fetal Ultrasounds or other Referrals:  None Maternal Substance Abuse:  No Significant Maternal Medications:  None Significant Maternal Lab Results:  Group B Strep negative Other Comments:  None  Medical / Surgical History :  Past medical history:  Past Medical History:  Diagnosis Date  . Fibroid   . Medical history non-contributory      Past surgical history:  Past Surgical History:  Procedure Laterality Date  . NO PAST SURGERIES       Family History: No family history on file.   Social History:  has no history on file for tobacco use, alcohol use, and drug use.  Allergies: Penicillins   Current Medications at time of admission:  Medications Prior to Admission  Medication Sig Dispense  Refill Last Dose  . Prenatal Vit-Fe Fumarate-FA (PRENATAL MULTIVITAMIN) TABS tablet Take 1 tablet by mouth daily at 12 noon.   01/24/2020 at Unknown time    Review of Systems: Review of Systems  All other systems reviewed and are negative.  Physical Exam: Vital signs and nursing notes reviewed.  Patient Vitals for the past 24 hrs:  BP Temp Temp src Pulse Resp Height Weight  01/25/20 1929 116/61 -- -- 93 -- -- --  01/25/20 1853 121/76 -- -- (!) 102 -- -- --  01/25/20 1455 130/83 97.8 F (36.6 C) Oral 87 18 -- --  01/25/20 1449 -- -- -- -- -- 5\' 4"  (1.626 m) 64.9 kg    General: AAO x 3, NAD Heart: RRR Lungs:CTAB Abdomen: Gravid, NT, Leopold's 7lbs Extremities: no edema  Genitalia / VE:  Dilation: 4-5 Effacement (%): 80 Cervical Position: Anterior Station: -2 Presentation: Vertex Exam by: D. Paul CNM in office this AM  FHR: 135BPM, mod variability, + accels, no decels TOCO: Contractions q 1-5 minutes  Labs:   Pending T&S, CBC, RPR  Recent Labs    01/25/20 1725  WBC 16.1*  HGB 15.2*  HCT 43.9  PLT 289   Assessment:  29 y.o. G2P1 at [redacted]w[redacted]d  1. Active stage of labor 2. FHR category 1 3. GBS negative 4. Desires unmedicated birth  5. Plans to breastfeed 6. Uterine fibroid 11cm x 11cm  Plan:  1. Admit to BS 2. Routine L&D orders 3. Analgesia/anesthesia PRN  4. Expectant  management 5. Anticipate NSVB  Dr. Benjie Karvonen notified of admission/plan of care  [place], MSN 01/25/2020, 8:11 PM

## 2020-01-26 LAB — CBC
HCT: 36.6 % (ref 36.0–46.0)
Hemoglobin: 12.8 g/dL (ref 12.0–15.0)
MCH: 32.8 pg (ref 26.0–34.0)
MCHC: 35 g/dL (ref 30.0–36.0)
MCV: 93.8 fL (ref 80.0–100.0)
Platelets: 295 10*3/uL (ref 150–400)
RBC: 3.9 MIL/uL (ref 3.87–5.11)
RDW: 12.7 % (ref 11.5–15.5)
WBC: 15.9 10*3/uL — ABNORMAL HIGH (ref 4.0–10.5)
nRBC: 0 % (ref 0.0–0.2)

## 2020-01-26 LAB — RPR: RPR Ser Ql: NONREACTIVE

## 2020-01-26 MED ORDER — INFLUENZA VAC SPLIT QUAD 0.5 ML IM SUSY: 0.5000 mL | PREFILLED_SYRINGE | Freq: Once | INTRAMUSCULAR | Status: DC

## 2020-01-26 MED ORDER — MEASLES, MUMPS & RUBELLA VAC IJ SOLR: 0.5000 mL | Freq: Once | INTRAMUSCULAR | Status: DC | PRN

## 2020-01-26 NOTE — Lactation Note (Signed)
This note was copied from a baby's chart. Lactation Consultation Note  Patient Name: Ashley Sanchez [id number] Date: 01/26/2020   [facility] entered room and family eating.  Baby STS on mother's chest.   Mother stated could we come back later today. Parents state baby has not breastfed since 35 last night and has been spitty. Recommend mother hand express and spoon feed baby and call lactation when ready for consult. Suggest putting him in crib when done eating and check diaper and unwrap to wake.      Maternal Data    Feeding Feeding Type: Breast Fed  LATCH Score                   Interventions    Lactation Tools Discussed/Used     Consult Status      Carlye Grippe 01/26/2020, 1:37 PM

## 2020-01-26 NOTE — Progress Notes (Signed)
PPD # 1 S/P NSVD  Live born female  Birth Weight: 8 lb 5.5 oz (3785 g) APGAR: 9, 9  Newborn Delivery   Birth date/time: 01/25/2020 19:04:00 Delivery type: Vaginal, Spontaneous     Baby name: Monroe Delivering provider: Derrell Lolling C  Episiotomy:None   Lacerations:2nd degree   circumcision planned tomorrow  Feeding: breast  Pain control at delivery: Local   S:  Reports feeling well             Tolerating po/ No nausea or vomiting             Bleeding is light             Pain controlled with acetaminophen and ibuprofen (OTC)             Up ad lib / ambulatory / voiding without difficulties   O:  A & O x 3, in no apparent distress              VS:  Vitals:   01/25/20 2200 01/26/20 0310 01/26/20 0800 01/26/20 1204  BP: 132/83 116/84 109/72 114/71  Pulse: 84 83 84 90  Resp: _0 Temp: 97.8 F (36.6 C) 97.9 F (36.6 C) 98.6 F (37 C) 98 F (36.7 C)  TempSrc: Oral Oral Oral Oral  SpO2: 98% 97%    Weight:      Height:        LABS:  Recent Labs    01/25/20 1725 01/26/20 0551  WBC 16.1* 15.9*  HGB 15.2* 12.8  HCT 43.9 36.6  PLT 289 295    Blood type: --/--/O POS (10/21 1725)  Rubella:  non-immune  I&O: I/O last 3 completed shifts: In: -  Out: 200 [Blood:200]          No intake/output data recorded.  Vaccines: TDaP          UTD         Flu             inpatient                    COVID-19 ?  Gen: AAO x 3, NAD  Abdomen: soft, non-tender, non-distended             Fundus: firm, non-tender, U@  Perineum: repair intact, no edema  Lochia: small  Extremities: no edema, no calf pain or tenderness    A/P: PPD # 1 29 y.o., G2P1001   Principal Problem:   Postpartum care following vaginal delivery 10/21 Active Problems:   SVD (spontaneous vaginal delivery) 10/21   Second degree perineal laceration   Uterine fibroid 11cm x 11cm x 12cm Rubella non immune  - MMR prior to DC  Doing well - stable status  Routine post partum orders  Anticipate  discharge tomorrow    Juliene Pina, MSN, CNM 01/26/2020, 2:21 PM

## 2020-01-26 NOTE — Lactation Note (Signed)
This note was copied from a baby's chart. Lactation Consultation Note  Patient Name: Ashley Sanchez JOINO'M Date: 01/26/2020 Reason for consult: Mother's request;Difficult latch;Follow-up assessment P1, 20 hour term female infant, -1% weight loss. Per dad, infant had 3 stools and one void since delivery. Per parents, infant has been sleepy and spitty and hasn't  BF since  2200 pm yesterday nor has she being doing hand expression and giving infant back volume if he hasn't been latching to the breast. [place] asked mom if she knew how to hand express and she said, " no!" LC assisted mom with hand expression and mom easily expressed 3 mls of colostrum that was spoon fed to infant. Afterwards, infant was more alert and started cuing to BF. Mom latched infant on her right breast using the football hold position, infant latched with depth and  a wide mouth,  nose and chin touching breast and infant was still BF after 10 minutes when LC left the room. Mom knows to BF infant according to cues, 8 to 12+ times within 24 hours, STS. Mom knows going forward to ask for assistance with latching infant if he is sleepy or not latching and to hand express and give infant back EBM until some one helps mom.  Mom knows to call RN or LC if she needs further assistance with latching infant at the breast. LC discussed [place] BF support group within the community after hospital discharge.   Maternal Data Formula Feeding for Exclusion: No Has patient been taught Hand Expression?: Yes  Feeding Feeding Type: Breast Fed  LATCH Score Latch: Grasps breast easily, tongue down, lips flanged, rhythmical sucking.  Audible Swallowing: A few with stimulation  Type of Nipple: Everted at rest and after stimulation  Comfort (Breast/Nipple): Soft / non-tender  Hold (Positioning): Assistance needed to correctly position infant at breast and maintain latch.  LATCH Score: 8  Interventions Interventions: Breast feeding  basics reviewed;Assisted with latch;Skin to skin;Adjust position;Support pillows;Position options;Expressed milk;Hand express;Breast compression  Lactation Tools Discussed/Used     Consult Status Consult Status: Follow-up Date: 01/27/20 Follow-up type: In-patient    Vicente Serene 01/26/2020, 3:27 PM

## 2020-01-27 MED ORDER — IBUPROFEN 600 MG PO TABS: 600.0000 mg | ORAL_TABLET | Freq: Four times a day (QID) | ORAL | 0 refills | Status: DC

## 2020-01-27 MED ORDER — BENZOCAINE-MENTHOL 20-0.5 % EX AERO: 1.0000 "application " | INHALATION_SPRAY | CUTANEOUS | Status: DC | PRN

## 2020-01-27 MED ORDER — ACETAMINOPHEN 325 MG PO TABS: 650.0000 mg | ORAL_TABLET | ORAL | Status: DC | PRN

## 2020-01-27 MED ORDER — COCONUT OIL OIL: 1.0000 "application " | TOPICAL_OIL | 0 refills | Status: DC | PRN

## 2020-01-27 NOTE — Lactation Note (Signed)
This note was copied from a baby's chart. Lactation Consultation Note  Patient Name: Boy Caley Ciaramitaro OUZHQ'U Date: 01/27/2020 Reason for consult: Follow-up assessment   Mother reports that infant is in nursery for circumcision. Advised mother to hand express and collect colostrum for infant for spoon feeding when he gets back . Infant maybe very sleepy and hard to rouse. Encouraged mother to  Make attempts even though infant is sleepy.   Mother reports that she has a difficult time getting infant to latch to the left breast. She has tried both football and cross cradle holds. Advised mother to pre-pump to soften breast and pull milk down.   Mother receptive to all tips.  Discussed treatment and prevention of engorgement. .   Mother to continue to cue base feed infant and feed at least 8-12 times or more in 24 hours and advised to allow for cluster feeding infant as needed.   Mother to continue to due STS. Mother is aware of available LC services at [facility], BFSG'S, OP Dept, and phone # for questions or concerns about breastfeeding.  Mother receptive to all teaching and plan of care.     Maternal Data    Feeding Feeding Type: Breast Fed  LATCH Score                   Interventions Interventions: Adjust position;Support pillows;Position options  Lactation Tools Discussed/Used     Consult Status Consult Status: Complete    Darla Lesches 01/27/2020, 12:23 PM

## 2020-01-27 NOTE — Discharge Summary (Signed)
Postpartum Discharge Summary  Date of Service updated 01/27/2020     Patient Name: Ashley Sanchez DOB: [birth year] MRN: [id number]  Date of admission: 01/25/2020 Delivery date:01/25/2020  Delivering provider: Juliene Pina  Date of discharge: 01/27/2020  Admitting diagnosis: Normal labor [O80, Z37.9] Intrauterine pregnancy: [redacted]w[redacted]d    Secondary diagnosis:  Principal Problem:   Postpartum care following vaginal delivery 10/21 Active Problems:   SVD (spontaneous vaginal delivery) 10/21   Second degree perineal laceration   Uterine fibroid 11cm x 11cm x 12cm  Additional problems:none    Discharge diagnosis: Term Pregnancy Delivered                                              Post partum procedures: n/a Augmentation: AROM Complications: None  Hospital course: Onset of Labor With Vaginal Delivery      29y.o. yo G2P1001 at 464w4das admitted in Active Labor on 01/25/2020. Patient had an uncomplicated labor course as follows:  Membrane Rupture Time/Date: 5:40 PM ,01/25/2020   Delivery Method:Vaginal, Spontaneous  Episiotomy: None  Lacerations:  2nd degree  Patient had an uncomplicated postpartum course.  She is ambulating, tolerating a regular diet, passing flatus, and urinating well. Patient is discharged home in stable condition on 01/27/20.  Newborn Data: Birth date:01/25/2020  Birth time:7:04 PM  Gender:Female  Living status:Living  Apgars:9 ,9  Weight:3785 g   "Monroe"   Magnesium Sulfate received: No BMZ received: No Rhophylac:N/A MMR:N/A T-DaP:Given prenatally Flu: Yes  Give prior to d/c Transfusion:No  Physical exam  Vitals:   01/26/20 0310 01/26/20 0800 01/26/20 1204 01/27/20 0545  BP: 116/84 109/72 114/71 110/69  Pulse: 83 84 90 91  Resp: _0 Temp: 97.9 F (36.6 C) 98.6 F (37 C) 98 F (36.7 C) 98 F (36.7 C)  TempSrc: Oral Oral Oral Oral  SpO2: 97%   98%  Weight:      Height:       General: alert, cooperative and no  distress Lochia: appropriate Uterine Fundus: firm below the umbilicus, large uterine fibroid palpated at umbilicus  Perineum: well approximated 2nd degree perineal laceration,  DVT Evaluation: No evidence of DVT seen on physical exam. No significant calf/ankle edema. Labs: Lab Results  Component Value Date   WBC 15.9 (H) 01/26/2020   HGB 12.8 01/26/2020   HCT 36.6 01/26/2020   MCV 93.8 01/26/2020   PLT 295 01/26/2020   No flowsheet data found. Edinburgh Score: Edinburgh Postnatal Depression Scale Screening Tool 01/26/2020  I have been able to laugh and see the funny side of things. 0  I have looked forward with enjoyment to things. 0  I have blamed myself unnecessarily when things went wrong. 1  I have been anxious or worried for no good reason. 0  I have felt scared or panicky for no good reason. 0  Things have been getting on top of me. 1  I have been so unhappy that I have had difficulty sleeping. 0  I have felt sad or miserable. 0  I have been so unhappy that I have been crying. 1  The thought of harming myself has occurred to me. 0  Edinburgh Postnatal Depression Scale Total 3      After visit meds:  Allergies as of 01/27/2020      Reactions   Penicillins  Has patient had a PCN reaction causing immediate rash, facial/tongue/throat swelling, SOB or lightheadedness with hypotension: Yes Has patient had a PCN reaction causing severe rash involving mucus membranes or skin necrosis: No Has patient had a PCN reaction that required hospitalization: No Has patient had a PCN reaction occurring within the last 10 years: Yes If all of the above answers are "NO", then may proceed with Cephalosporin use.      Medication List    TAKE these medications   acetaminophen 325 MG tablet Commonly known as: Tylenol Take 2 tablets (650 mg total) by mouth every 4 (four) hours as needed (for pain scale < 4).   benzocaine-Menthol 20-0.5 % Aero Commonly known as: DERMOPLAST Apply 1  application topically as needed for irritation (perineal discomfort).   coconut oil Oil Apply 1 application topically as needed.   ibuprofen 600 MG tablet Commonly known as: ADVIL Take 1 tablet (600 mg total) by mouth every 6 (six) hours.   prenatal multivitamin Tabs tablet Take 1 tablet by mouth daily at 12 noon.            Discharge Care Instructions  (From admission, onward)         Start     Ordered   01/27/20 0000  Discharge wound care:       Comments: Warm water sitz baths PRN   01/27/20 0848           Discharge home in stable condition Infant Feeding: Breast Infant Disposition:home with mother Discharge instruction: per After Visit Summary and Postpartum booklet. Activity: Advance as tolerated. Pelvic rest for 6 weeks.  Diet: low salt diet Anticipated Birth Control: Unsure Postpartum Appointment:6 weeks Additional Postpartum F/U: Postpartum Depression checkup Future Appointments:No future appointments. Follow up Visit:  Follow-up Information    Juliene Pina, CNM. Schedule an appointment as soon as possible for a visit in 6 week(s).   Specialty: Obstetrics and Gynecology Why: Postpartum visit  Contact information: [place] [address] [phone]                   01/27/2020 Darliss Cheney, CNM

## 2021-04-06 NOTE — L&D Delivery Note (Signed)
      Delivery Note:   G3P1001 at [redacted]w[redacted]d Admitting diagnosis: Normal labor [O80, Z37.9] Risks: Non cavitary fibroid, fundal, 9 cm diameter Hx PPROM with large subchorionic hemorrhage at 16 wks and delivery 151w6dFirst Stage:  Induction of labor:NA Onset of labor: 01/21/2022 1700 Augmentation: N/A ROM: 0123 01/22/2022 clear fluid Active labor onset: 01/21/2022 2230 Hydrotherapy: tub not available Analgesia /Anesthesia/Pain control intrapartum: Local   Second Stage:  Complete dilation at 01/22/2022  0123 Onset of pushing at 0123 FHR second stage category 1   Pushing in HKHopkintonosition then L lateral for delivery with CNM and L&D staff support, Elmer present for birth and supportive. Nuchal Cord: Yes , reduced after delivery of body through somersault maneuver Delivery of a Live born child  Birth Weight:  pending APGAR: 8, 9  Newborn Delivery   Birth date/time: 01/22/2022 01:30:00 Delivery type: Vaginal, Spontaneous      in cephalic presentation, position OA to ROT. Shoulders and body delivered after short pause awaiting uterine contraction, then with good maternal effort and minimal traction of the neck along the axial plane of the body, anterior shoulder released slowly then posterior shoulder delivered easily, body somersaulted towards the maternal L thigh, terminal meconium noted.  Newborn with strong cry after stimulation, flexed tone, dried and stimulated, placed on maternal abdomen.  Cord double clamped after cessation of pulsation, cut by [employer] Collection of cord blood for typing completed. Cord blood donation-None  Arterial cord blood sample-No    Third Stage:  Placenta delivered 01/22/2022 at 0139-Spontaneous  with 3 vessels . Uterine tone firm, large fundal fibroid appreciated at U +2, bleeding initially scant then intermittent small trickling noted. LUS swept for clots and small membrane remnant removed, then repeat sweep up to mid uterine body removed  another small piece of membrane.  Uterus fundus firm and difficult to sweep past mid uterine shelf, minimal bleeding observed with monitoring over several minutes.  Uterotonics: Pitocin 10 units IM after baby delivery, then Pitocin 30 units IV bolus after placenta.  Methergine PO series started prophylactic for fibroid uterus and LGA fetus. Placenta inspected and appears complete, membrane margins are difficult to appreciate if intact due to trailing appearance. Placenta to L&D for disposal.   Unasyn 3 gm IV one dose prophylactic for uterine exploration.  Patient reports PCN allergic reaction remote 10 yrs ago, notes rash with amoxacillin given during bronchitis episode. Discussed r/b of PCN derivatives and small likelihood of anaphylactic reaction, agrees to inpatient trial of Unasyn, will monitor closely for reaction.  2nd degree  laceration identified.  Episiotomy:None  Local analgesia: 1% lido  Repair:3.0 vicryl in standard fashion Est. Blood Loss (mLOF):121.97 Complications: None   Mom to postpartum.  Baby (no name yet) to Couplet care / Skin to Skin.  Delivery Report:  Review the Delivery Report for details.     Signed: DaJuliene PinaCNM, MSN 01/22/2022, 2:43 AM

## 2022-01-22 ENCOUNTER — Inpatient Hospital Stay (HOSPITAL_COMMUNITY)
Admission: AD | Admit: 2022-01-22 | Discharge: 2022-01-23 | DRG: 807 | Disposition: A | Discharge status: Home / Self Care | Payer: Medicaid Other | Attending: Obstetrics and Gynecology | Admitting: Obstetrics and Gynecology

## 2022-01-22 ENCOUNTER — Encounter (HOSPITAL_COMMUNITY): Payer: Self-pay | Admitting: Obstetrics & Gynecology

## 2022-01-22 ENCOUNTER — Other Ambulatory Visit: Payer: Self-pay

## 2022-01-22 DIAGNOSIS — D259 Leiomyoma of uterus, unspecified: Secondary | ICD-10-CM | POA: Diagnosis present

## 2022-01-22 DIAGNOSIS — O3413 Maternal care for benign tumor of corpus uteri, third trimester: Secondary | ICD-10-CM | POA: Diagnosis present

## 2022-01-22 DIAGNOSIS — Z3A40 40 weeks gestation of pregnancy: Secondary | ICD-10-CM

## 2022-01-22 DIAGNOSIS — Z880 Allergy status to penicillin: Secondary | ICD-10-CM

## 2022-01-22 DIAGNOSIS — O3663X0 Maternal care for excessive fetal growth, third trimester, not applicable or unspecified: Secondary | ICD-10-CM | POA: Diagnosis present

## 2022-01-22 DIAGNOSIS — O480 Post-term pregnancy: Secondary | ICD-10-CM | POA: Diagnosis present

## 2022-01-22 DIAGNOSIS — O26893 Other specified pregnancy related conditions, third trimester: Secondary | ICD-10-CM | POA: Diagnosis present

## 2022-01-22 LAB — CBC
HCT: 35.9 % — ABNORMAL LOW (ref 36.0–46.0)
Hemoglobin: 13.2 g/dL (ref 12.0–15.0)
MCH: 33.2 pg (ref 26.0–34.0)
MCHC: 36.8 g/dL — ABNORMAL HIGH (ref 30.0–36.0)
MCV: 90.2 fL (ref 80.0–100.0)
Platelets: 243 10*3/uL (ref 150–400)
RBC: 3.98 MIL/uL (ref 3.87–5.11)
RDW: 13.9 % (ref 11.5–15.5)
WBC: 14 10*3/uL — ABNORMAL HIGH (ref 4.0–10.5)
nRBC: 0 % (ref 0.0–0.2)

## 2022-01-22 LAB — TYPE AND SCREEN
ABO/RH(D): O POS
Antibody Screen: NEGATIVE

## 2022-01-22 MED ORDER — SENNOSIDES-DOCUSATE SODIUM 8.6-50 MG PO TABS
2.0000 | ORAL_TABLET | ORAL | Status: DC
  Administered 2022-01-22 – 2022-01-23 (×2): 2 via ORAL
  Filled 2022-01-22 (×2): qty 2

## 2022-01-22 MED ORDER — TETANUS-DIPHTH-ACELL PERTUSSIS 5-2.5-18.5 LF-MCG/0.5 IM SUSY: 0.5000 mL | PREFILLED_SYRINGE | Freq: Once | INTRAMUSCULAR | Status: DC

## 2022-01-22 MED ORDER — BISACODYL 10 MG RE SUPP: 10.0000 mg | Freq: Every day | RECTAL | Status: DC | PRN

## 2022-01-22 MED ORDER — BENZOCAINE-MENTHOL 20-0.5 % EX AERO
1.0000 | INHALATION_SPRAY | CUTANEOUS | Status: DC | PRN
  Filled 2022-01-22: qty 56

## 2022-01-22 MED ORDER — DIBUCAINE (PERIANAL) 1 % EX OINT: 1.0000 | TOPICAL_OINTMENT | CUTANEOUS | Status: DC | PRN

## 2022-01-22 MED ORDER — OXYTOCIN BOLUS FROM INFUSION: 333.0000 mL | Freq: Once | INTRAVENOUS | Status: DC

## 2022-01-22 MED ORDER — OXYTOCIN 10 UNIT/ML IJ SOLN: 10.0000 [IU] | Freq: Once | INTRAMUSCULAR | Status: AC

## 2022-01-22 MED ORDER — OXYTOCIN-SODIUM CHLORIDE 30-0.9 UT/500ML-% IV SOLN
INTRAVENOUS | Status: AC
  Administered 2022-01-22: 2.5 [IU]/h via INTRAVENOUS
  Filled 2022-01-22: qty 500

## 2022-01-22 MED ORDER — ACETAMINOPHEN 500 MG PO TABS
1000.0000 mg | ORAL_TABLET | Freq: Four times a day (QID) | ORAL | Status: DC
  Administered 2022-01-22 – 2022-01-23 (×5): 1000 mg via ORAL
  Filled 2022-01-22 (×5): qty 2

## 2022-01-22 MED ORDER — ONDANSETRON HCL 4 MG/2ML IJ SOLN: 4.0000 mg | INTRAMUSCULAR | Status: DC | PRN

## 2022-01-22 MED ORDER — LACTATED RINGERS IV SOLN: INTRAVENOUS | Status: DC

## 2022-01-22 MED ORDER — ACETAMINOPHEN 325 MG PO TABS: 650.0000 mg | ORAL_TABLET | ORAL | Status: DC | PRN

## 2022-01-22 MED ORDER — OXYTOCIN 10 UNIT/ML IJ SOLN
INTRAMUSCULAR | Status: AC
  Administered 2022-01-22: 10 [IU] via INTRAMUSCULAR
  Filled 2022-01-22: qty 1

## 2022-01-22 MED ORDER — OXYTOCIN-SODIUM CHLORIDE 30-0.9 UT/500ML-% IV SOLN: 2.5000 [IU]/h | INTRAVENOUS | Status: DC

## 2022-01-22 MED ORDER — ZOLPIDEM TARTRATE 5 MG PO TABS: 5.0000 mg | ORAL_TABLET | Freq: Every evening | ORAL | Status: DC | PRN

## 2022-01-22 MED ORDER — COCONUT OIL OIL: 1.0000 | TOPICAL_OIL | Status: DC | PRN

## 2022-01-22 MED ORDER — PRENATAL MULTIVITAMIN CH
1.0000 | ORAL_TABLET | Freq: Every day | ORAL | Status: DC
  Administered 2022-01-22: 1 via ORAL
  Filled 2022-01-22: qty 1

## 2022-01-22 MED ORDER — ONDANSETRON HCL 4 MG PO TABS: 4.0000 mg | ORAL_TABLET | ORAL | Status: DC | PRN

## 2022-01-22 MED ORDER — METHYLERGONOVINE MALEATE 0.2 MG/ML IJ SOLN: 0.2000 mg | INTRAMUSCULAR | Status: AC

## 2022-01-22 MED ORDER — DIPHENHYDRAMINE HCL 25 MG PO CAPS: 25.0000 mg | ORAL_CAPSULE | Freq: Four times a day (QID) | ORAL | Status: DC | PRN

## 2022-01-22 MED ORDER — SIMETHICONE 80 MG PO CHEW: 80.0000 mg | CHEWABLE_TABLET | ORAL | Status: DC | PRN

## 2022-01-22 MED ORDER — IBUPROFEN 600 MG PO TABS
600.0000 mg | ORAL_TABLET | Freq: Four times a day (QID) | ORAL | Status: DC
  Administered 2022-01-22 – 2022-01-23 (×4): 600 mg via ORAL
  Filled 2022-01-22 (×4): qty 1

## 2022-01-22 MED ORDER — SODIUM CHLORIDE 0.9 % IV SOLN
3.0000 g | Freq: Four times a day (QID) | INTRAVENOUS | Status: DC
  Administered 2022-01-22: 3 g via INTRAVENOUS
  Filled 2022-01-22 (×3): qty 8

## 2022-01-22 MED ORDER — METHYLERGONOVINE MALEATE 0.2 MG PO TABS
0.2000 mg | ORAL_TABLET | ORAL | Status: AC
  Administered 2022-01-22 (×6): 0.2 mg via ORAL
  Filled 2022-01-22 (×6): qty 1

## 2022-01-22 MED ORDER — LIDOCAINE HCL (PF) 1 % IJ SOLN
INTRAMUSCULAR | Status: AC
  Administered 2022-01-22: 30 mL via SUBCUTANEOUS
  Filled 2022-01-22: qty 5

## 2022-01-22 MED ORDER — LACTATED RINGERS IV SOLN: 500.0000 mL | INTRAVENOUS | Status: DC | PRN

## 2022-01-22 MED ORDER — WITCH HAZEL-GLYCERIN EX PADS: 1.0000 | MEDICATED_PAD | CUTANEOUS | Status: DC | PRN

## 2022-01-22 MED ORDER — ONDANSETRON HCL 4 MG/2ML IJ SOLN: 4.0000 mg | Freq: Four times a day (QID) | INTRAMUSCULAR | Status: DC | PRN

## 2022-01-22 MED ORDER — LIDOCAINE HCL (PF) 1 % IJ SOLN
INTRAMUSCULAR | Status: AC
  Filled 2022-01-22: qty 30

## 2022-01-22 MED ORDER — SOD CITRATE-CITRIC ACID 500-334 MG/5ML PO SOLN: 30.0000 mL | ORAL | Status: DC | PRN

## 2022-01-22 MED ORDER — FLEET ENEMA 7-19 GM/118ML RE ENEM: 1.0000 | ENEMA | Freq: Every day | RECTAL | Status: DC | PRN

## 2022-01-22 MED ORDER — LIDOCAINE HCL (PF) 1 % IJ SOLN: 30.0000 mL | INTRAMUSCULAR | Status: AC | PRN

## 2022-01-22 NOTE — Lactation Note (Signed)
This note was copied from a baby's chart. Lactation Consultation Note  Patient Name: Ashley Sanchez YIRSW'N Date: 01/22/2022 Reason for consult: Initial assessment;Term Age:29 hours  LC in to visit with P2 Mom of term baby.  Mom stopped breastfeeding her first baby 9 months ago.  Mom feels confident and feels baby is latching well.  Encouraged STS with baby and feeding often with cues.  Mom denies any questions at present.  Maternal Data Has patient been taught Hand Expression?: No Does the patient have breastfeeding experience prior to this delivery?: Yes How long did the patient breastfeed?: 14 months with her almost 29 yr old  Feeding Mother's Current Feeding Choice: Breast Milk   Interventions Interventions: Breast feeding basics reviewed;Skin to skin;Breast massage;Hand express;LC Services brochure  Discharge Pump: Hands Free;DEBP;Personal (Medela and Elvie) WIC Program: No  Consult Status Consult Status: Follow-up Date: 01/23/22 Follow-up type: In-patient    Broadus John 01/22/2022, 11:05 AM

## 2022-01-22 NOTE — MAU Note (Signed)
..  Ashley Sanchez is a 31 y.o. at 65w6dhere in MAU reporting: intense contractions that are now   Vitals:   01/22/22 0113  BP: (!) 141/76  Pulse: 91  Resp: 20  Temp: 98.4 F (36.9 C)  SpO2: 100%     D. Paul CNM at bedside 0107 SVE: 8 100 -1

## 2022-01-22 NOTE — MAU Provider Note (Signed)
Ashley Sanchez is a 31 y.o. female presenting for contractions. Labor started in the afternoon, patient called provider at 2230 to report variable strength contractions, some intense and then some very light, and wanted to wait until more consistency noted. Planning natural labor/WB. Denies LOF, notes small amount of bloody show once presented in MAU.  Spouse Elmer present and supportive. Expecting baby boy with undecided name, plans circumcision.   Prenatal course complicated by large fundal fibroid, last measured 9cm diam, stable throughout pregnancy.  Hx chronic abruption with G1 and PPROM at 16 wks, delivered [redacted]w[redacted]d G2 uncomplicated NSVB. .Marland KitchenOB History     Gravida  3   Para  2   Term  1   Preterm      AB      Living  1      SAB      IAB      Ectopic      Multiple  0   Live Births  1          Past Medical History:  Diagnosis Date   Fibroid    Medical history non-contributory    Past Surgical History:  Procedure Laterality Date   NO PAST SURGERIES     Family History: family history is not on file. Social History:  has no history on file for tobacco use, alcohol use, and drug use.     Maternal Diabetes: No Genetic Screening: Normal AFP, declined NIPS Maternal Ultrasounds/Referrals: Normal Fetal Ultrasounds or other Referrals:  None Maternal Substance Abuse:  No Significant Maternal Medications:  None Significant Maternal Lab Results:  Group B Strep negative Number of Prenatal Visits:greater than 3 verified prenatal visits Other Comments:  None  Review of Systems Painful ctx, + FM, no LOF All other systems reviewed and are negative.     Exam Blood pressure 134/61, pulse 90, temperature 98.4 F (36.9 C), temperature source Oral, resp. rate 20, SpO2 100 %, unknown if currently breastfeeding.  General: AAO x 3, NAD Heart: RRR Lungs:CTAB Abdomen: Gravid, NT, Leopold's 7lbs Extremities: no edema   Dilation: 10 Effacement (%): 100 Station: 0 Exam  by:: DDerrell Lolling CNM   FHR: 135BPM, mod variability, + accels, no decels TOCO: Contractions q 2 minutes  Prenatal labs: ABO, Rh:   O POS Antibody: NEG (10/21 1725) Rubella:   Immune RPR:   Non-reactive HBsAg:   Negative HIV:   Negative GBS:   Negative 1 hr Glucola : 77 Genetic Screening: Declined NIPS, AFP negative  Declined all vaccines  Assessment/Plan: G3P1001 at 484w6dActive labor, anticipate 2nd stage FHR Category 1  Admit to OB, will remain in MAU for impending delivery, no L&D rooms available at the moment. Prep room for delivery, CNM support for natural labor Routine admit orders AMTSL for risk of PPH with large fibroid    DaJuliene PinaCNM 01/22/2022, 2:23 AM

## 2022-01-23 NOTE — Lactation Note (Signed)
This note was copied from a baby's chart. Lactation Consultation Note  Patient Name: Ashley Sanchez KGURK'Y Date: 01/23/2022 Reason for consult: Follow-up assessment;Term;Infant weight loss (baby latched with depth and per mom comfortable. LC reviewed BF D/C teaching and [place]  resources after D/C. Per mom baby has been latching well.) Age:29 hours  Maternal Data    Feeding Mother's Current Feeding Choice: Breast Milk  LATCH Score Latch:  (latched with depth)  Audible Swallowing:  (multiple swallows)     Comfort (Breast/Nipple):  (per mom comfortable)  Hold (Positioning):  (mom independent with latch)      Lactation Tools Discussed/Used    Interventions Interventions: Breast feeding basics reviewed;Skin to skin;Education;LC Services brochure  Discharge Discharge Education: Engorgement and breast care;Warning signs for feeding baby Pump: DEBP;Manual;Personal;Hands Free  Consult Status Consult Status: Complete Date: 01/23/22    Myer Haff 01/23/2022, 10:52 AM

## 2022-01-23 NOTE — Discharge Summary (Signed)
OB Discharge Summary  Patient Name: Ashley Sanchez DOB: 01-27-1991 MRN: [id number]  Date of admission: 01/22/2022 Delivering provider: Juliene Sanchez   Admitting diagnosis: Normal labor [O80, Z37.9] Intrauterine pregnancy: [redacted]w[redacted]d    Secondary diagnosis: Patient Active Problem List   Diagnosis Date Noted   Normal labor 01/22/2022   SVD (spontaneous vaginal delivery) 10/19 01/25/2020   Second degree perineal laceration 01/25/2020   Postpartum care following vaginal delivery 10/19 01/25/2020   Uterine fibroid 9 cm, fundal 01/25/2020    Date of discharge: 01/23/2022   Discharge diagnosis: Principal Problem:   Postpartum care following vaginal delivery 10/19 Active Problems:   SVD (spontaneous vaginal delivery) 10/19   Second degree perineal laceration   Uterine fibroid 9 cm, fundal   Normal labor                                                             Augmentation: N/A Pain control: Local  [name]:4YJdegree  Complications: None  Hospital course:  Onset of Labor With Vaginal Delivery      31y.o. yo G3P2002 at 441w6das admitted in Active Labor on 01/22/2022.  Membrane Rupture Time/Date: 1:23 AM ,01/22/2022   Delivery Method:Vaginal, Spontaneous  Episiotomy: None  Lacerations:  2nd degree  Patient had an uncomplicated postpartum course. She is ambulating, tolerating a regular diet, passing flatus, and urinating well. Patient is discharged home in stable condition on 01/23/22.  Newborn Data: Birth date:01/22/2022  Birth time:1:30 AM  Gender:Female  Living status:Living  Apgars:8 ,9  Weight:4275 g   Physical exam  Vitals:   01/22/22 1315 01/22/22 1715 01/22/22 2026 01/23/22 0542  BP: 130/83 122/79 134/84 120/84  Pulse: 78 91 70 66  Resp: '18 18 17 18  '$ Temp: 98.1 F (36.7 C) 97.9 F (36.6 C) 97.7 F (36.5 C) 97.7 F (36.5 C)  TempSrc: Oral Oral Oral Oral  SpO2: 100% 99% 99% 99%  Weight:      Height:       General: alert, cooperative, and no  distress Lochia: appropriate Uterine Fundus: firm Perineum: repair intact, no edema DVT Evaluation: No evidence of DVT seen on physical exam.  Labs: Lab Results  Component Value Date   WBC 14.0 (H) 01/22/2022   HGB 13.2 01/22/2022   HCT 35.9 (L) 01/22/2022   MCV 90.2 01/22/2022   PLT 243 01/22/2022      01/22/2022    9:15 AM 01/26/2020    8:00 AM 07/10/2017   10:09 AM  Edinburgh Postnatal Depression Scale Screening Tool  I have been able to laugh and see the funny side of things. 0 0 0  I have looked forward with enjoyment to things. 0 0 0  I have blamed myself unnecessarily when things went wrong. 1 1 0  I have been anxious or worried for no good reason. 0 0 1  I have felt scared or panicky for no good reason. 0 0 0  Things have been getting on top of me. 1 1 0  I have been so unhappy that I have had difficulty sleeping. 0 0 0  I have felt sad or miserable. 0 0 0  I have been so unhappy that I have been crying. 0 1 0  The thought of harming myself has occurred to me.  0 0 0  Edinburgh Postnatal Depression Scale Total '2 3 1   '$ Discharge instructions:  per After Visit Summary  After Visit Meds:  Allergies as of 01/23/2022       Reactions   Penicillins    Has patient had a PCN reaction causing immediate rash, facial/tongue/throat swelling, SOB or lightheadedness with hypotension: Yes Has patient had a PCN reaction causing severe rash involving mucus membranes or skin necrosis: No Has patient had a PCN reaction that required hospitalization: No Has patient had a PCN reaction occurring within the last 10 years: Yes If all of the above answers are "NO", then may proceed with Cephalosporin use.        Medication List     TAKE these medications    prenatal multivitamin Tabs tablet Take 1 tablet by mouth daily at 12 noon.       Activity: Advance as tolerated. Pelvic rest for 6 weeks.   Newborn Data: Live born female  Birth Weight: 9 lb 6.8 oz (4275 g) APGAR: 8,  9  Newborn Delivery   Birth date/time: 01/22/2022 01:30:00 Delivery type: Vaginal, Spontaneous     Named Ashley Sanchez Baby Feeding: Breast Circumcision: Completed/D. Ashley Sanchez CNM Disposition:home with mother  Delivery Report:  Review the Delivery Report for details.    Follow up:  Follow-up Information     Ashley Few, MD. Schedule an appointment as soon as possible for a visit in 6 week(s).   Specialty: Obstetrics and Gynecology Contact information: [place] [address] Ashley Sanchez, CNM, MSN 01/23/2022, 8:33 AM

## 2022-01-23 NOTE — Plan of Care (Signed)
  Problem: Education: Goal: Knowledge of Childbirth will improve Outcome: Adequate for Discharge Goal: Ability to make informed decisions regarding treatment and plan of care will improve Outcome: Adequate for Discharge Goal: Ability to state and carry out methods to decrease the pain will improve Outcome: Adequate for Discharge Goal: Individualized Educational Video(s) Outcome: Adequate for Discharge   Problem: Coping: Goal: Ability to verbalize concerns and feelings about labor and delivery will improve Outcome: Adequate for Discharge   Problem: Life Cycle: Goal: Ability to make normal progression through stages of labor will improve Outcome: Adequate for Discharge Goal: Ability to effectively push during vaginal delivery will improve Outcome: Adequate for Discharge   Problem: Role Relationship: Goal: Will demonstrate positive interactions with the child Outcome: Adequate for Discharge   Problem: Safety: Goal: Risk of complications during labor and delivery will decrease Outcome: Adequate for Discharge   Problem: Pain Management: Goal: Relief or control of pain from uterine contractions will improve Outcome: Adequate for Discharge   Problem: Education: Goal: Knowledge of condition will improve Outcome: Adequate for Discharge Goal: Individualized Educational Video(s) Outcome: Adequate for Discharge Goal: Individualized Newborn Educational Video(s) Outcome: Adequate for Discharge   Problem: Activity: Goal: Will verbalize the importance of balancing activity with adequate rest periods Outcome: Adequate for Discharge Goal: Ability to tolerate increased activity will improve Outcome: Adequate for Discharge   Problem: Coping: Goal: Ability to identify and utilize available resources and services will improve Outcome: Adequate for Discharge   Problem: Life Cycle: Goal: Chance of risk for complications during the postpartum period will decrease Outcome: Adequate for  Discharge   Problem: Role Relationship: Goal: Ability to demonstrate positive interaction with newborn will improve Outcome: Adequate for Discharge   Problem: Skin Integrity: Goal: Demonstration of wound healing without infection will improve Outcome: Adequate for Discharge   Problem: Education: Goal: Knowledge of General Education information will improve Description: Including pain rating scale, medication(s)/side effects and non-pharmacologic comfort measures Outcome: Adequate for Discharge   Problem: Health Behavior/Discharge Planning: Goal: Ability to manage health-related needs will improve Outcome: Adequate for Discharge   Problem: Clinical Measurements: Goal: Ability to maintain clinical measurements within normal limits will improve Outcome: Adequate for Discharge Goal: Will remain free from infection Outcome: Adequate for Discharge Goal: Diagnostic test results will improve Outcome: Adequate for Discharge Goal: Respiratory complications will improve Outcome: Adequate for Discharge Goal: Cardiovascular complication will be avoided Outcome: Adequate for Discharge   Problem: Activity: Goal: Risk for activity intolerance will decrease Outcome: Adequate for Discharge   Problem: Nutrition: Goal: Adequate nutrition will be maintained Outcome: Adequate for Discharge   Problem: Coping: Goal: Level of anxiety will decrease Outcome: Adequate for Discharge   Problem: Elimination: Goal: Will not experience complications related to bowel motility Outcome: Adequate for Discharge Goal: Will not experience complications related to urinary retention Outcome: Adequate for Discharge   Problem: Pain Managment: Goal: General experience of comfort will improve Outcome: Adequate for Discharge   Problem: Safety: Goal: Ability to remain free from injury will improve Outcome: Adequate for Discharge   Problem: Skin Integrity: Goal: Risk for impaired skin integrity will  decrease Outcome: Adequate for Discharge

## 2022-01-26 NOTE — H&P (Signed)
      OB ADMISSION/ HISTORY & PHYSICAL:  Admission Date: 01/22/2022  1:01 AM  Admit Diagnosis: Normal labor [O80, Z37.9]    Ashley Sanchez is a 31 y.o. female presenting for contractions. Labor started in the afternoon, patient called provider at 2230 to report variable strength contractions, some intense and then some very light, and wanted to wait until more consistency noted. Planning natural labor/WB. Denies LOF, notes small amount of bloody show once presented in [facility].  Spouse Elmer present and supportive. Expecting baby boy with undecided name, plans circumcision.   Prenatal course complicated by large fundal fibroid, last measured 9cm diam, stable throughout pregnancy.  Hx chronic abruption with G1 and PPROM at 16 wks, delivered [redacted]w[redacted]d G2 uncomplicated NSVB. .Marland KitchenOB History     Gravida  3   Para  2   Term  1   Preterm      AB      Living  1      SAB      IAB      Ectopic      Multiple  0   Live Births  1          Past Medical History:  Diagnosis Date   Fibroid    Medical history non-contributory    Past Surgical History:  Procedure Laterality Date   NO PAST SURGERIES     Family History: family history is not on file. Social History:  has no history on file for tobacco use, alcohol use, and drug use.     Maternal Diabetes: No Genetic Screening: Normal AFP, declined NIPS Maternal Ultrasounds/Referrals: Normal Fetal Ultrasounds or other Referrals:  None Maternal Substance Abuse:  No Significant Maternal Medications:  None Significant Maternal Lab Results:  Group B Strep negative Number of Prenatal Visits:greater than 3 verified prenatal visits Other Comments:  None  Review of Systems Painful ctx, + FM, no LOF All other systems reviewed and are negative.     Exam Blood pressure 134/61, pulse 90, temperature 98.4 F (36.9 C), temperature source Oral, resp. rate 20, SpO2 100 %, unknown if currently breastfeeding.  General: AAO x 3, NAD Heart:  RRR Lungs:CTAB Abdomen: Gravid, NT, Leopold's 7lbs Extremities: no edema   Dilation: 10 Effacement (%): 100 Station: 0 Exam by:: DDerrell Lolling CNM   FHR: 135BPM, mod variability, + accels, no decels TOCO: Contractions q 2 minutes  Prenatal labs: ABO, Rh:   O POS Antibody: NEG (10/21 1725) Rubella:   Immune RPR:   Non-reactive HBsAg:   Negative HIV:   Negative GBS:   Negative 1 hr Glucola : 77 Genetic Screening: Declined NIPS, AFP negative  Declined all vaccines  Assessment/Plan: G3P1001 at 439w6dActive labor, anticipate 2nd stage FHR Category 1  Admit to OB, will remain in [facility]U for impending delivery, no L&D rooms available at the moment. Prep room for delivery, CNM support for natural labor Routine admit orders AMTSL for risk of PPH with large fibroid    DaJuliene PinaCNM 01/22/2022, 2:23 AM

## 2022-02-04 ENCOUNTER — Telehealth (HOSPITAL_COMMUNITY): Payer: Self-pay | Admitting: *Deleted

## 2022-02-04 NOTE — Telephone Encounter (Signed)
Left phone voicemail message.  Odis Hollingshead, RN 02-04-2022 at 1:40pm
